# Patient Record
Sex: Female | Born: 1980 | Race: White | Hispanic: No | Marital: Married | Smoking: Never smoker
Health system: Southern US, Community
[De-identification: ages and names within clinical notes are randomized; demographics above are authoritative.]

## PROBLEM LIST (undated history)

## (undated) DIAGNOSIS — S0300XA Dislocation of jaw, unspecified side, initial encounter: Secondary | ICD-10-CM

## (undated) HISTORY — PX: TENDON REPAIR: SHX5111

## (undated) HISTORY — DX: Dislocation of jaw, unspecified side, initial encounter: S03.00XA

---

## 2011-02-12 ENCOUNTER — Ambulatory Visit: Payer: Self-pay | Admitting: Internal Medicine

## 2014-04-12 ENCOUNTER — Ambulatory Visit: Payer: Self-pay | Admitting: Specialist

## 2014-04-30 ENCOUNTER — Encounter: Payer: Self-pay | Admitting: Specialist

## 2014-05-21 ENCOUNTER — Encounter: Payer: Self-pay | Admitting: Specialist

## 2014-06-19 ENCOUNTER — Encounter
Admit: 2014-06-19 | Disposition: A | Discharge status: Home / Self Care | Payer: Self-pay | Attending: Specialist | Admitting: Specialist

## 2014-07-20 ENCOUNTER — Encounter
Admit: 2014-07-20 | Disposition: A | Discharge status: Home / Self Care | Payer: Self-pay | Attending: Specialist | Admitting: Specialist

## 2014-08-15 NOTE — Op Note (Signed)
PATIENT NAME:  Dana Baldwin, Dana Baldwin MR#:  [id number] DATE OF BIRTH:  1981-01-09  DATE OF PROCEDURE:  04/12/2014  PREOPERATIVE DIAGNOSIS: Laceration both flexor tendons, right little finger.   POSTOPERATIVE DIAGNOSIS: Laceration of flexor profundus tendon, right little finger.   PROCEDURE: Repair of flexor profundus tendon, right little finger in zone 2.   SURGEON: Myra Rudehristopher Rachel Rison, M.Baldwin.   ANESTHESIA: General.   COMPLICATIONS: None.   TOURNIQUET TIME: Approximately 50 minutes.   DESCRIPTION OF PROCEDURE: Two grams of Ancef was given intravenously prior to the procedure. General anesthesia is induced. The patient's arm and hand are thoroughly prepped with alcohol and ChloraPrep and draped in standard sterile fashion. The extremity is wrapped out with the Esmarch bandage and pneumatic tourniquet elevated to 250 mmHg. The original wound which is healed is transverse and is at the volar PIP joint. This was entered and then under loupe magnification was extended in a PrienBruner fashion both proximally and distally. The dissection is carried down to the flexor tendon sheath. The flexor superficialis tendon is seen to be intact. The flexor profundus tendon was completely lacerated but had not retracted into the palm. Approximately 50% of the A4 pulley and 50% of the A2 pulley were necessary to bring the tendon ends together by flexing the distal joint. Repair of the tendon was then performed using a modified Kessler type suture with a separate horizontal mattress suture, all of 3-0 Ethibond. A separate 6-0 nylon was then used around the rim of the repair to smooth this off. Range of motion of the finger was performed and there was excellent gliding of the tendon. The wound is thoroughly irrigated multiple times. Skin edges are closed with 5-0 nylon. A soft bulky dressing is applied with a dorsal splint keeping the wrist flexed approximately 20 degrees and the MP joint of the little finger and ring finger flexed  approximately 20 degrees. The tourniquet is released and the patient is returned to the recovery room in satisfactory condition having tolerated the procedure quite well.  ____________________________ Clare Gandyhristopher E. Vivek Grealish, MD ces:sb Baldwin: 04/19/2014 13:43:21 ET T: 04/19/2014 14:03:56 ET JOB#: [id number]  cc: Clare Gandyhristopher E. Dorota Heinrichs, MD, <Dictator> Clare GandyHRISTOPHER E Briane Birden MD ELECTRONICALLY SIGNED 04/24/2014 12:06

## 2017-01-08 NOTE — H&P (Signed)
Patient ID: Dana Baldwin is a 36 y.o. female presenting with Ovarian Cyst  on 01/08/2017  HPI: Pt with acute pelvic pain since 12/30/16. TVUS found bilateral large simple ovarian cysts, and on repeat u/s today the cysts were noted to be larger, 8 and 5cm, up from 6 and 4 cm 2 weeks ago.  The IUD is in the proper fundal location, and she loves it. Pain medicine is not helping any more and she is finding it difficult to complete ADLS.   Past Medical History:  has a past medical history of Allergic rhinitis and Other nonspecific abnormal finding.  Past Surgical History:  has a past surgical history that includes repair extensor tendon finger. Family History: family history includes Hereditary spherocytosis in her father and sister; High blood pressure (Hypertension) in her father. Social History:  reports that she has never smoked. She has never used smokeless tobacco. She reports that she drinks alcohol. She reports that she does not use drugs. OB/GYN History:  OB History    Gravida Para Term Preterm AB Living   SAB TAB Ectopic Molar Multiple Live Births             2      Allergies: is allergic to mucinex [guaifenesin] and sudafed [pseudoephedrine hcl]. Medications:  Current Outpatient Prescriptions:  .  acetaminophen (TYLENOL) 500 mg capsule, Take 500 mg by mouth as needed for Fever., Disp: , Rfl:  .  levonorgestrel (MIRENA) 20 mcg/24 hr (5 years) IUD, Insert 1 each into the uterus once. Follow package directions., Disp: , Rfl:  .  oxyCODONE-acetaminophen (PERCOCET) 5-325 mg tablet, Take 1 tablet by mouth every 4 (four) hours as needed for Pain., Disp: 20 tablet, Rfl: 0   Review of Systems: No SOB, no palpitations or chest pain, no new lower extremity edema, no nausea or vomiting or bowel or bladder complaints. See HPI for gyn specific ROS.   Exam:     BP 128/70   Ht 160 cm ( )   Wt 64.4 kg (142 lb)   BMI 25.15 kg/m   General: Patient is well-groomed,  well-nourished, appears stated age in no acute distress  HEENT: head is atraumatic and normocephalic, trachea is midline, neck is supple with no palpable nodules  CV: Regular rhythm and normal heart rate, no murmur  Pulm: Clear to auscultation throughout lung fields with no wheezing, crackles, or rhonchi. No increased work of breathing  Abdomen: soft , no mass, non-tender, no rebound tenderness, no hepatomegaly  Pelvic: deferred  Impression:   The primary encounter diagnosis was Cyst of ovary, left. A diagnosis of Dermoid cyst of ovary, right was also pertinent to this visit.    Plan:    Patient returns for a preoperative discussion regarding her plans to proceed with surgical treatment of her acute pelvic pain and ovarian cysts by dx lap with bilateral ovarian cystectomy procedure.  The patient and I discussed the technical aspects of the procedure including the potential for risks and complications.  These include but are not limited to the risk of infection requiring post-operative antibiotics or further procedures.  We talked about the risk of injury to adjacent organs including bladder, bowel, ureter, blood vessels or nerves.  We talked about the need to convert to an open incision.  We talked about the possible need for blood transfusion.  We talked about postop complications such as thromboembolic or cardiopulmonary complications.  All of her questions  were answered.  Her preoperative exam was completed and the appropriate consents were signed. She is scheduled to undergo this procedure in the near future.  Specific Peri-operative Considerations:  - Consent: obtained today - Labs: CBC, CMP preoperatively - Studies: EKG, CXR preoperatively - Bowel Preparation: None required - Abx:  None indicated - VTE ppx: SCDs perioperatively

## 2017-01-11 ENCOUNTER — Ambulatory Visit
Admission: RE | Admit: 2017-01-11 | Discharge: 2017-01-11 | Disposition: A | Discharge status: Home / Self Care | Payer: Managed Care, Other (non HMO) | Source: Ambulatory Visit | Attending: Obstetrics and Gynecology | Admitting: Obstetrics and Gynecology

## 2017-01-11 ENCOUNTER — Encounter: Payer: Self-pay | Admitting: *Deleted

## 2017-01-11 ENCOUNTER — Ambulatory Visit: Payer: Managed Care, Other (non HMO) | Admitting: Certified Registered"

## 2017-01-11 ENCOUNTER — Encounter
Admission: RE | Disposition: A | Discharge status: Home / Self Care | Payer: Self-pay | Source: Ambulatory Visit | Attending: Obstetrics and Gynecology

## 2017-01-11 DIAGNOSIS — N8301 Follicular cyst of right ovary: Secondary | ICD-10-CM | POA: Insufficient documentation

## 2017-01-11 DIAGNOSIS — Z888 Allergy status to other drugs, medicaments and biological substances status: Secondary | ICD-10-CM | POA: Diagnosis not present

## 2017-01-11 DIAGNOSIS — N83209 Unspecified ovarian cyst, unspecified side: Secondary | ICD-10-CM | POA: Diagnosis present

## 2017-01-11 DIAGNOSIS — R102 Pelvic and perineal pain: Secondary | ICD-10-CM | POA: Diagnosis present

## 2017-01-11 DIAGNOSIS — N8302 Follicular cyst of left ovary: Secondary | ICD-10-CM | POA: Diagnosis not present

## 2017-01-11 DIAGNOSIS — Z960 Presence of urogenital implants: Secondary | ICD-10-CM | POA: Insufficient documentation

## 2017-01-11 HISTORY — PX: LAPAROSCOPIC OVARIAN CYSTECTOMY: SHX6248

## 2017-01-11 LAB — BASIC METABOLIC PANEL
ANION GAP: 7 (ref 5–15)
BUN: 11 mg/dL (ref 6–20)
CHLORIDE: 106 mmol/L (ref 101–111)
CO2: 25 mmol/L (ref 22–32)
Calcium: 8.9 mg/dL (ref 8.9–10.3)
Creatinine, Ser: 0.52 mg/dL (ref 0.44–1.00)
GFR calc Af Amer: >60 mL/min (ref >60)
GLUCOSE: 112 mg/dL — AB (ref 65–99)
POTASSIUM: 3.8 mmol/L (ref 3.5–5.1)
Sodium: 138 mmol/L (ref 135–145)

## 2017-01-11 LAB — ABO/RH: ABO/RH(D): O POS

## 2017-01-11 LAB — TYPE AND SCREEN
ABO/RH(D): O POS
Antibody Screen: NEGATIVE

## 2017-01-11 LAB — CBC
HCT: 37.4 % (ref 35.0–47.0)
HEMOGLOBIN: 13.3 g/dL (ref 12.0–16.0)
MCH: 31.9 pg (ref 26.0–34.0)
MCHC: 35.7 g/dL (ref 32.0–36.0)
MCV: 89.6 fL (ref 80.0–100.0)
PLATELETS: 269 10*3/uL (ref 150–440)
RBC: 4.18 MIL/uL (ref 3.80–5.20)
RDW: 12.7 % (ref 11.5–14.5)
WBC: 10.9 10*3/uL (ref 3.6–11.0)

## 2017-01-11 LAB — POCT PREGNANCY, URINE: PREG TEST UR: NEGATIVE

## 2017-01-11 SURGERY — EXCISION, CYST, OVARY, LAPAROSCOPIC
Anesthesia: General | Site: Abdomen | Laterality: Bilateral | Wound class: Clean Contaminated

## 2017-01-11 MED ORDER — PHENYLEPHRINE HCL 10 MG/ML IJ SOLN
INTRAMUSCULAR | Status: DC | PRN
  Administered 2017-01-11: 100 ug via INTRAVENOUS

## 2017-01-11 MED ORDER — FENTANYL CITRATE (PF) 100 MCG/2ML IJ SOLN
INTRAMUSCULAR | Status: DC | PRN
  Administered 2017-01-11: 100 ug via INTRAVENOUS
  Administered 2017-01-11: 50 ug via INTRAVENOUS

## 2017-01-11 MED ORDER — ACETAMINOPHEN 500 MG PO TABS: 1000.0000 mg | ORAL_TABLET | Freq: Four times a day (QID) | ORAL | 0 refills | Status: AC

## 2017-01-11 MED ORDER — DEXAMETHASONE SODIUM PHOSPHATE 10 MG/ML IJ SOLN
INTRAMUSCULAR | Status: DC | PRN
  Administered 2017-01-11: 10 mg via INTRAVENOUS

## 2017-01-11 MED ORDER — MIDAZOLAM HCL 2 MG/2ML IJ SOLN
INTRAMUSCULAR | Status: DC | PRN
  Administered 2017-01-11: 2 mg via INTRAVENOUS

## 2017-01-11 MED ORDER — SUGAMMADEX SODIUM 200 MG/2ML IV SOLN
INTRAVENOUS | Status: AC
  Filled 2017-01-11: qty 2

## 2017-01-11 MED ORDER — IBUPROFEN 800 MG PO TABS
800.0000 mg | ORAL_TABLET | Freq: Three times a day (TID) | ORAL | 1 refills | Status: DC | PRN
Start: 2017-01-11 — End: 2020-03-18

## 2017-01-11 MED ORDER — DEXAMETHASONE SODIUM PHOSPHATE 10 MG/ML IJ SOLN
INTRAMUSCULAR | Status: AC
Start: 2017-01-11 — End: 2017-01-11
  Filled 2017-01-11: qty 2

## 2017-01-11 MED ORDER — GABAPENTIN 300 MG PO CAPS
ORAL_CAPSULE | ORAL | Status: AC
  Administered 2017-01-11: 900 mg via ORAL
  Filled 2017-01-11: qty 3

## 2017-01-11 MED ORDER — PROPOFOL 10 MG/ML IV BOLUS
INTRAVENOUS | Status: DC | PRN
  Administered 2017-01-11: 150 mg via INTRAVENOUS

## 2017-01-11 MED ORDER — OXYCODONE HCL 5 MG PO TABS
5.0000 mg | ORAL_TABLET | Freq: Once | ORAL | Status: AC
  Administered 2017-01-11: 5 mg via ORAL
  Filled 2017-01-11: qty 1

## 2017-01-11 MED ORDER — FENTANYL CITRATE (PF) 100 MCG/2ML IJ SOLN
INTRAMUSCULAR | Status: AC
  Administered 2017-01-11: 25 ug via INTRAVENOUS
  Filled 2017-01-11: qty 2

## 2017-01-11 MED ORDER — OXYCODONE HCL 5 MG PO TABS
ORAL_TABLET | ORAL | Status: AC
  Filled 2017-01-11: qty 1

## 2017-01-11 MED ORDER — ONDANSETRON HCL 4 MG/2ML IJ SOLN
INTRAMUSCULAR | Status: AC
  Filled 2017-01-11: qty 4

## 2017-01-11 MED ORDER — KETOROLAC TROMETHAMINE 30 MG/ML IJ SOLN
INTRAMUSCULAR | Status: AC
  Filled 2017-01-11: qty 2

## 2017-01-11 MED ORDER — BUPIVACAINE HCL (PF) 0.5 % IJ SOLN
INTRAMUSCULAR | Status: AC
  Filled 2017-01-11: qty 30

## 2017-01-11 MED ORDER — DOCUSATE SODIUM 100 MG PO CAPS: 100.0000 mg | ORAL_CAPSULE | Freq: Two times a day (BID) | ORAL | 0 refills | Status: DC

## 2017-01-11 MED ORDER — SUGAMMADEX SODIUM 200 MG/2ML IV SOLN
INTRAVENOUS | Status: DC | PRN
Start: 2017-01-11 — End: 2017-01-11
  Administered 2017-01-11: 130 mg via INTRAVENOUS

## 2017-01-11 MED ORDER — FAMOTIDINE 20 MG PO TABS
ORAL_TABLET | ORAL | Status: AC
  Administered 2017-01-11: 20 mg via ORAL
  Filled 2017-01-11: qty 1

## 2017-01-11 MED ORDER — ONDANSETRON HCL 4 MG/2ML IJ SOLN
INTRAMUSCULAR | Status: DC | PRN
  Administered 2017-01-11: 4 mg via INTRAVENOUS

## 2017-01-11 MED ORDER — LACTATED RINGERS IV SOLN: INTRAVENOUS | Status: DC

## 2017-01-11 MED ORDER — OXYCODONE HCL 5 MG PO CAPS: 5.0000 mg | ORAL_CAPSULE | Freq: Four times a day (QID) | ORAL | 0 refills | Status: DC | PRN

## 2017-01-11 MED ORDER — LIDOCAINE HCL (CARDIAC) 20 MG/ML IV SOLN
INTRAVENOUS | Status: DC | PRN
  Administered 2017-01-11: 100 mg via INTRAVENOUS

## 2017-01-11 MED ORDER — FAMOTIDINE 20 MG PO TABS
20.0000 mg | ORAL_TABLET | Freq: Once | ORAL | Status: AC
  Administered 2017-01-11: 20 mg via ORAL

## 2017-01-11 MED ORDER — FENTANYL CITRATE (PF) 100 MCG/2ML IJ SOLN
INTRAMUSCULAR | Status: AC
  Filled 2017-01-11: qty 2

## 2017-01-11 MED ORDER — ROCURONIUM BROMIDE 100 MG/10ML IV SOLN
INTRAVENOUS | Status: DC | PRN
  Administered 2017-01-11: 40 mg via INTRAVENOUS
  Administered 2017-01-11: 10 mg via INTRAVENOUS

## 2017-01-11 MED ORDER — ACETAMINOPHEN 500 MG PO TABS
ORAL_TABLET | ORAL | Status: AC
  Administered 2017-01-11: 1000 mg via ORAL
  Filled 2017-01-11: qty 2

## 2017-01-11 MED ORDER — LIDOCAINE HCL (PF) 2 % IJ SOLN
INTRAMUSCULAR | Status: AC
  Filled 2017-01-11: qty 2

## 2017-01-11 MED ORDER — ACETAMINOPHEN 500 MG PO TABS
1000.0000 mg | ORAL_TABLET | ORAL | Status: AC
  Administered 2017-01-11: 1000 mg via ORAL

## 2017-01-11 MED ORDER — MIDAZOLAM HCL 2 MG/2ML IJ SOLN
INTRAMUSCULAR | Status: AC
  Filled 2017-01-11: qty 2

## 2017-01-11 MED ORDER — FENTANYL CITRATE (PF) 100 MCG/2ML IJ SOLN
25.0000 ug | INTRAMUSCULAR | Status: DC | PRN
  Administered 2017-01-11 (×4): 25 ug via INTRAVENOUS

## 2017-01-11 MED ORDER — LACTATED RINGERS IV SOLN
INTRAVENOUS | Status: DC | PRN
  Administered 2017-01-11: 13:00:00 via INTRAVENOUS

## 2017-01-11 MED ORDER — BUPIVACAINE HCL 0.5 % IJ SOLN
INTRAMUSCULAR | Status: DC | PRN
  Administered 2017-01-11: 10 mL

## 2017-01-11 MED ORDER — GLYCOPYRROLATE 0.2 MG/ML IJ SOLN
INTRAMUSCULAR | Status: DC | PRN
  Administered 2017-01-11: 0.2 mg via INTRAVENOUS

## 2017-01-11 MED ORDER — GABAPENTIN 300 MG PO CAPS
900.0000 mg | ORAL_CAPSULE | ORAL | Status: AC
  Administered 2017-01-11: 900 mg via ORAL

## 2017-01-11 MED ORDER — ONDANSETRON HCL 4 MG/2ML IJ SOLN: 4.0000 mg | Freq: Once | INTRAMUSCULAR | Status: DC | PRN

## 2017-01-11 SURGICAL SUPPLY — 47 items
APPLICATOR ARISTA FLEXITIP XL (MISCELLANEOUS) ×3 IMPLANT
APPLICATOR COTTON TIP 6IN STRL (MISCELLANEOUS) ×3 IMPLANT
BAG URO DRAIN 2000ML W/SPOUT (MISCELLANEOUS) IMPLANT
BLADE SURG SZ11 CARB STEEL (BLADE) ×3 IMPLANT
CATH FOLEY 2WAY  5CC 16FR (CATHETERS) ×2
CATH ROBINSON RED A/P 16FR (CATHETERS) ×3 IMPLANT
CATH URTH 16FR FL 2W BLN LF (CATHETERS) ×1 IMPLANT
CHLORAPREP W/TINT 26ML (MISCELLANEOUS) ×3 IMPLANT
CLOSURE WOUND 1/4X4 (GAUZE/BANDAGES/DRESSINGS) ×1
DERMABOND ADVANCED (GAUZE/BANDAGES/DRESSINGS) ×2
DERMABOND ADVANCED .7 DNX12 (GAUZE/BANDAGES/DRESSINGS) ×1 IMPLANT
DRSG TEGADERM 2-3/8X2-3/4 SM (GAUZE/BANDAGES/DRESSINGS) ×9 IMPLANT
GAUZE SPONGE NON-WVN 2X2 STRL (MISCELLANEOUS) ×2 IMPLANT
GLOVE BIO SURGEON STRL SZ7 (GLOVE) ×6 IMPLANT
GLOVE INDICATOR 7.5 STRL GRN (GLOVE) ×3 IMPLANT
GOWN STRL REUS W/ TWL LRG LVL3 (GOWN DISPOSABLE) ×2 IMPLANT
GOWN STRL REUS W/TWL LRG LVL3 (GOWN DISPOSABLE) ×4
GRASPER SUT TROCAR 14GX15 (MISCELLANEOUS) ×3 IMPLANT
HEMOSTAT ARISTA ABSORB 1G (MISCELLANEOUS) ×3 IMPLANT
IRRIGATION STRYKERFLOW (MISCELLANEOUS) ×1 IMPLANT
IRRIGATOR STRYKERFLOW (MISCELLANEOUS) ×3
IV NS 1000ML (IV SOLUTION) ×2
IV NS 1000ML BAXH (IV SOLUTION) ×1 IMPLANT
KIT PINK PAD W/HEAD ARE REST (MISCELLANEOUS) ×3
KIT PINK PAD W/HEAD ARM REST (MISCELLANEOUS) ×1 IMPLANT
KIT RM TURNOVER CYSTO AR (KITS) ×3 IMPLANT
LABEL OR SOLS (LABEL) ×3 IMPLANT
LIGASURE LAP MARYLAND 5MM 37CM (ELECTROSURGICAL) ×3 IMPLANT
NS IRRIG 500ML POUR BTL (IV SOLUTION) ×3 IMPLANT
PACK GYN LAPAROSCOPIC (MISCELLANEOUS) ×3 IMPLANT
PAD OB MATERNITY 4.3X12.25 (PERSONAL CARE ITEMS) ×3 IMPLANT
PAD PREP 24X41 OB/GYN DISP (PERSONAL CARE ITEMS) ×3 IMPLANT
POUCH SPECIMEN RETRIEVAL 10MM (ENDOMECHANICALS) ×3 IMPLANT
SCISSORS METZENBAUM CVD 33 (INSTRUMENTS) ×3 IMPLANT
SLEEVE ENDOPATH XCEL 5M (ENDOMECHANICALS) ×3 IMPLANT
SPONGE VERSALON 2X2 STRL (MISCELLANEOUS) ×4
STRIP CLOSURE SKIN 1/4X4 (GAUZE/BANDAGES/DRESSINGS) ×2 IMPLANT
SUT MNCRL AB 4-0 PS2 18 (SUTURE) ×3 IMPLANT
SUT VIC AB 0 CT1 36 (SUTURE) ×3 IMPLANT
SUT VIC AB 2-0 UR6 27 (SUTURE) ×3 IMPLANT
SUT VIC AB 4-0 SH 27 (SUTURE) ×2
SUT VIC AB 4-0 SH 27XANBCTRL (SUTURE) ×1 IMPLANT
SWABSTK COMLB BENZOIN TINCTURE (MISCELLANEOUS) ×3 IMPLANT
SYR 30ML LL (SYRINGE) ×3 IMPLANT
TROCAR XCEL NON-BLD 11X100MML (ENDOMECHANICALS) ×3 IMPLANT
TROCAR XCEL NON-BLD 5MMX100MML (ENDOMECHANICALS) ×6 IMPLANT
TUBING INSUFFLATOR HI FLOW (MISCELLANEOUS) ×3 IMPLANT

## 2017-01-11 NOTE — Anesthesia Postprocedure Evaluation (Signed)
Anesthesia Post Note  Patient: NASHANTI DUQUETTE  Procedure(s) Performed: Procedure(s) (LRB): LAPAROSCOPIC OVARIAN CYSTECTOMY (Bilateral)  Patient location during evaluation: PACU Anesthesia Type: General Level of consciousness: awake and alert and oriented Pain management: pain level controlled Vital Signs Assessment: post-procedure vital signs reviewed and stable Respiratory status: spontaneous breathing, nonlabored ventilation and respiratory function stable Cardiovascular status: blood pressure returned to baseline and stable Postop Assessment: no signs of nausea or vomiting Anesthetic complications: no     Last Vitals:  Vitals:   01/11/17 1536 01/11/17 1551  BP: (!) 105/41 (!) 102/45  Pulse: 60 80  Resp: 16 13  Temp:    SpO2: 100% 95%    Last Pain:  Vitals:   01/11/17 1602  TempSrc:   PainSc: 5                  Zina Pitzer

## 2017-01-11 NOTE — Anesthesia Post-op Follow-up Note (Signed)
Anesthesia QCDR form completed.        

## 2017-01-11 NOTE — Anesthesia Preprocedure Evaluation (Signed)
Anesthesia Evaluation  Patient identified by MRN, date of birth, ID band Patient awake    Reviewed: Allergy & Precautions, NPO status , Patient's Chart, lab work & pertinent test results  Airway Mallampati: II  TM Distance: >3 FB     Dental   Pulmonary neg pulmonary ROS,    Pulmonary exam normal        Cardiovascular negative cardio ROS Normal cardiovascular exam     Neuro/Psych negative neurological ROS  negative psych ROS   GI/Hepatic negative GI ROS, Neg liver ROS,   Endo/Other  negative endocrine ROS  Renal/GU negative Renal ROS  Female GU complaint     Musculoskeletal negative musculoskeletal ROS (+)   Abdominal Normal abdominal exam  (+)   Peds negative pediatric ROS (+)  Hematology negative hematology ROS (+)   Anesthesia Other Findings   Reproductive/Obstetrics                             Anesthesia Physical Anesthesia Plan  ASA: I  Anesthesia Plan: General   Post-op Pain Management:    Induction: Intravenous  PONV Risk Score and Plan:   Airway Management Planned: Oral ETT  Additional Equipment:   Intra-op Plan:   Post-operative Plan: Extubation in OR  Informed Consent: I have reviewed the patients History and Physical, chart, labs and discussed the procedure including the risks, benefits and alternatives for the proposed anesthesia with the patient or authorized representative who has indicated his/her understanding and acceptance.   Dental advisory given  Plan Discussed with: CRNA and Surgeon  Anesthesia Plan Comments:         Anesthesia Quick Evaluation

## 2017-01-11 NOTE — Anesthesia Procedure Notes (Signed)
Procedure Name: Intubation Date/Time: 01/11/2017 2:07 PM Performed by: Lance Muss Pre-anesthesia Checklist: Patient identified, Patient being monitored, Timeout performed, Emergency Drugs available and Suction available Patient Re-evaluated:Patient Re-evaluated prior to induction Oxygen Delivery Method: Circle system utilized Preoxygenation: Pre-oxygenation with 100% oxygen Induction Type: IV induction Ventilation: Mask ventilation without difficulty Laryngoscope Size: Mac and 3 Grade View: Grade I Tube type: Oral Tube size: 7.0 mm Number of attempts: 1 Airway Equipment and Method: Stylet Placement Confirmation: ETT inserted through vocal cords under direct vision,  positive ETCO2 and breath sounds checked- equal and bilateral Secured at: 21 cm Tube secured with: Tape Dental Injury: Teeth and Oropharynx as per pre-operative assessment

## 2017-01-11 NOTE — Discharge Instructions (Addendum)
Here is a helpful article from the website http://mitchell.org/, regarding constipation  Here are reasons why constipation occurs after surgery: 1) During the operation and in the recovery room, most people are given opioid pain medication, primarily through an IV, to treat moderate or severe pain. Intravenous opioids include morphine, Dilaudid and fentanyl. After surgery, patients are often prescribed opioid pain medication to take by mouth at home, including codeine, Vicodin, Norco, and Percocet. All of these medications cause constipation by slowing down the movement of your intestine. 2) Changes in your diet before surgery can be another culprit. It is common to get specific instructions to change how you normally eat or drink before your surgery, like only having liquids the day before or not having anything to eat or drink after midnight the night before surgery. For this reason, temporary dehydration may occur. This, along with not eating or only having liquids, means that you are getting less fiber than usual. Both these factors contribute to constipation. 3) Changes in your diet after surgery can also contribute to the problem. Although many people dont have dietary restrictions after operations, being under anesthesia can make you lose your appetite for several hours and maybe even days. Some people can even have nausea or vomiting. Not eating or drinking normally means that you are not getting enough fiber and you can get dehydrated, both leading to constipation. 4) Lying in a bed more than usual--which happens before, during and after surgery--combined with the medications and diet changes, all work together to slow down your colon and make your poop turn to rock.  No one likes to be constipated.  Lets face it, its not a pleasant feeling when you dont poop for days, then strain on the toilet to finally pass something large enough to cause damage. An ounce of prevention is worth a pound  of cure, so: 1. Assume you will be constipated. 2. Plan and prepare accordingly. Post-surgery is one of those unique situations where the temporary use of laxatives can make a world of difference. Always consult with your doctor, and recognize that if you wait several days after surgery to take a laxative, the constipation might be too severe for these over-the-counter options. It is always important to discuss all medications you plan on taking with your doctor. Ask your doctor if you can start the laxative immediately after surgery. *  Here are go-to post-surgery laxatives: Senna: Senna is an herb that acts as a stimulant laxative, meaning it increases the activity of the intestine to cause you to have a bowel movement. It comes in many forms, but senna pills are easy to take and are sold over the counter at almost all pharmacies. Since opioid pain medications slow down the activity of the intestine, it makes sense to take a medication to help reverse that side effect. Long-term use of a stimulant laxative is not a good idea since it can make your colon lazy and not function properly; however, temporary use immediately after surgery is acceptable. In general, if you are able to eat a normal diet, taking senna soon after surgery works the best. Senna usually works within hours to produce a bowel movement, but this is less predictable when you are taking different medications after surgery. Try not to wait several days to start taking senna, as often it is too late by then. Just like with all medications or supplements, check with your doctor before starting new treatment.   Magnesium: Magnesium  is an important mineral that our body needs. We get magnesium from some foods that we eat, especially foods that are high in fiber such as broccoli, almonds and whole grains. There are also magnesium-based medications used to treat constipation including milk of magnesia (magnesium hydroxide), magnesium citrate  and magnesium oxide. They work by drawing water into the intestine, putting it into the class of osmotic laxatives. Magnesium products in low doses appear to be safe, but if taken in very large doses, can lead to problems such as irregular heartbeat, low blood pressure and even death. It can also affect other medications you might be taking, therefore it is important to discuss using magnesium with your physician and pharmacist before initiating therapy. Most over-the-counter magnesium laxatives work very well to help with the constipation related to surgery, but sometimes they work too well and lead to diarrhea. Make sure you are somewhere with easy access to a bathroom, just in case.   Bisacodyl: Bisacodyl (generic name) is sold under brand names such as Dulcolax. Much like senna, it is a stimulant laxative, meaning it makes your intestines move more quickly to push out the stool. This is another good choice to start taking as soon as your doctor says you can take a laxative after surgery. It comes in pill form and as a suppository, which is a good choice for people who cannot or are not allowed to swallow pills. Studies have shown that it works as a laxative, but like most of these medications, you should use this on a short-term basis only.   Enema: Enemas strike fear in many people, but FEAR NOT! Its nowhere near as big a deal as you may think. An enema is just a way to get some liquid into your rectum by placing a specially designed device through your anus. If you have never done one, it might seem like a painful, unpleasant, uncomfortable, complicated and lengthy procedure. But in reality, its simple, takes just a few seconds and is highly effective. The small ready-made bottles you buy at the pharmacy are much easier than the hose/large rubber container type. Those recommended positions illustrated in some instructions are generally not necessary to place the enema. Its very similar to the  insertion of a tampon, requiring a slight squat. Some extra lubrication on the enemas tip (or on your anus) will make it a breeze. In certain cases, there is no substitute for a good enema. For example, if someone has not pooped for a few days, the beginning of the poop waiting to come out can become rock hard. Passing that hard stool can lead to much pain and problems like anal fissures. Inserting a little liquid to break up the rock-hard stool will help make its passage much easier. Enemas come with different liquids. Most come with saline, but there are also mineral oil options. You can also use warm water in the reusable enema containers. They all work. But since saline can sometimes be irritating, so try a mineral oil or water enema instead.  Here are commonly recommended constipation medications that do not work well for post-surgery constipation: Docusate: Docusate (generic name) most commonly referred to as Colace (brand name) is not really a laxative, but is classified as a stool softener. Although this medication is commonly prescribed, it is not recommended for several reasons: 1) there is no good medical evidence that it works 2) even if it has an effect, which is very questionable, it is minimal and cannot combat the  intestinal slowing caused by the opioid medications. Skip docusate to save money and space in your pillbox for something more effective.  PEG: Miralax (brand name) is basically a chemical called polyethylene glycol (PEG) and it has gained tremendous popularity as a laxative. This product is an osmotic laxative meaning it works by pulling water into the stool, making it softer. This is very similar to the action of natural fiber in foods and supplements. Therefore, the effect seen by this medication is not immediate, causing a bowel movement in a day or more. Is this medication strong enough to battle the constipation related to having an operation? Maybe for some people not prone  to constipation. But for most people, other laxatives are better to prevent constipation after surgery. Laparoscopic Ovarian Surgery Discharge Instructions  RISKS AND COMPLICATIONS   Infection.  Bleeding.  Injury to surrounding organs.  Anesthetic side effects.   PROCEDURE   You may be given a medicine to help you relax (sedative) before the procedure. You will be given a medicine to make you sleep (general anesthetic) during the procedure.  A tube will be put down your throat to help your breath while under general anesthesia.  Several small cuts (incisions) are made in the lower abdominal area and one incision is made near the belly button.  Your abdominal area will be inflated with a safe gas (carbon dioxide). This helps give the surgeon room to operate, visualize, and helps the surgeon avoid other organs.  A thin, lighted tube (laparoscope) with a camera attached is inserted into your abdomen through the incision near the belly button. Other small instruments may also be inserted through other abdominal incisions.  The ovary is located and are removed.  After the ovary is removed, the gas is released from the abdomen.  The incisions will be closed with stitches (sutures), and Dermabond. A bandage may be placed over the incisions.  AFTER THE PROCEDURE   You will also have some mild abdominal discomfort for 3-7 days. You will be given pain medicine to ease any discomfort.  As long as there are no problems, you may be allowed to go home. Someone will need to drive you home and be with you for at least 24 hours once home.  You may have some mild discomfort in the throat. This is from the tube placed in your throat while you were sleeping.  You may experience discomfort in the shoulder area from some trapped air between the liver and diaphragm. This sensation is normal and will slowly go away on its own.  HOME CARE INSTRUCTIONS   Take all medicines as directed.  Only take  over-the-counter or prescription medicines for pain, discomfort, or fever as directed by your caregiver.  Resume daily activities as directed.  Showers are preferred over baths for 2 weeks.  You may resume sexual activities in 1 week or as you feel you would like to.  Do not drive while taking narcotics.  SEEK MEDICAL CARE IF: .  There is increasing abdominal pain.  You feel lightheaded or faint.  You have the chills.  You have an oral temperature above 102 F (38.9 C).  There is pus-like (purulent) drainage from any of the wounds.  You are unable to pass gas or have a bowel movement.  You feel sick to your stomach (nauseous) or throw up (vomit) and can't control it with your medicines.  MAKE SURE YOU:   Understand these instructions.  Will watch your condition.  Will  get help right away if you are not doing well or get worse.  ExitCare Patient Information 2013 [place], [place].    AMBULATORY SURGERY  DISCHARGE INSTRUCTIONS   1) The drugs that you were given will stay in your system until tomorrow so for the next 24 hours you should not:  A) Drive an automobile B) Make any legal decisions C) Drink any alcoholic beverage   2) You may resume regular meals tomorrow.  Today it is better to start with liquids and gradually work up to solid foods.  You may eat anything you prefer, but it is better to start with liquids, then soup and crackers, and gradually work up to solid foods.   3) Please notify your doctor immediately if you have any unusual bleeding, trouble breathing, redness and pain at the surgery site, drainage, fever, or pain not relieved by medication.    4) Additional Instructions:        Please contact your physician with any problems or Same Day Surgery at [phone], Monday through Friday 6 am to 4 pm, or [place] at [facility] number at [phone].

## 2017-01-11 NOTE — Transfer of Care (Signed)
Immediate Anesthesia Transfer of Care Note  Patient: Dana Baldwin  Procedure(s) Performed: Procedure(s): LAPAROSCOPIC OVARIAN CYSTECTOMY (Bilateral)  Patient Location: PACU  Anesthesia Type:General  Level of Consciousness: drowsy and patient cooperative  Airway & Oxygen Therapy: Patient Spontanous Breathing and Patient connected to face mask oxygen  Post-op Assessment: Report given to RN and Post -op Vital signs reviewed and stable  Post vital signs: Reviewed and stable  Last Vitals:  Vitals:   01/11/17 1110 01/11/17 1140  BP:  (!) 108/46  Pulse:  61  Resp: 18   Temp:  36.7 C  SpO2:  100%    Last Pain:  Vitals:   01/11/17 1140  TempSrc: Oral  PainSc:          Complications: No apparent anesthesia complications

## 2017-01-11 NOTE — Op Note (Signed)
Dana Baldwin PROCEDURE DATE: 01/11/2017  INDICATIONS: Acute pelvic pain  PREOPERATIVE DIAGNOSIS: Bilateral Adnexal mass POSTOPERATIVE DIAGNOSIS: Large simple bilateral ovarian cysts, left ruptured and bleeding PROCEDURE: Exam under anesthesia, diagnostic laparoscopy, bilateral ovarian cystectomy SURGEON:  Dr. Christeen Douglas ASSISTANT: CST ANESTHESIOLOGIST: Alver Fisher, MD Anesthesiologist: Yves Dill, MD; Alver Fisher, MD CRNA: Casey Burkitt, CRNA; Deri Fuelling, CRNA; Michaele Offer, CRNA  INDICATIONS: 36 y.o. with history of acute-onset pelvic pain desiring surgical evaluation.   Please see preoperative notes for further details.  Risks of surgery were discussed with the patient including but not limited to: bleeding which may require transfusion or reoperation; infection which may require antibiotics; injury to bowel, bladder, ureters or other surrounding organs; need for additional procedures including laparotomy; thromboembolic phenomenon, incisional problems and other postoperative/anesthesia complications. Written informed consent was obtained.    FINDINGS:  Small uterus, enlarged bilateral ovaries and normal fallopian tubes bilaterally. Ovarian cysts on left and right side. +small hemorrhagic old blood appearing lesions on epiploica in pelvis.  Normal upper abdomen. + 50cc of Hemoperitoneum  ANESTHESIA:    General INTRAVENOUS FLUIDS: 900 ml ESTIMATED BLOOD LOSS: 0 ml HEMOPERITONEUM: 50ml URINE OUTPUT: 75 ml SPECIMENS: Ovarian cyst walls bilaterally COMPLICATIONS: None immediate  PROCEDURE IN DETAIL:  The patient had sequential compression devices applied to her lower extremities while in the preoperative area.  She was then taken to the operating room where general anesthesia was administered and was found to be adequate.  She was placed in the dorsal lithotomy position, and was prepped and draped in a sterile manner.  A Foley catheter was inserted into her bladder and  attached to constant drainage and a uterine manipulator was then advanced into the uterus .  After an adequate timeout was performed, attention was turned to the abdomen where an umbilical incision was made with the scalpel.  The Optiview 5-mm trocar and sleeve were then advanced without difficulty with the laparoscope under direct visualization into the abdomen.  The abdomen was then insufflated with carbon dioxide gas and adequate pneumoperitoneum was obtained.   A detailed survey of the patient's pelvis and abdomen revealed the findings as mentioned above. Two additional 5mm trocars were placed in the bilateral lower quadrants under direct visualization, though the left LQ port was replaced with a 11mm port to accommodate the bag.   The ovarian cysts were evaluated and dissected out from the normal ovarian tissue. The cyst walls were placed in a bad Hemostasis was assured with electrocautery. The pelvis was irrigated and all fluid and blood removed. The patient was placed in reverse Trendelenburg and all fluid removed. Arista placed over ovarian cyst bed.  Blood clots removed and small epiploical lesion removed with Ligasure.   The operative site was surveyed, and it was found to be hemostatic.  No intraoperative injury to surrounding organs was noted.   Pictures were taken of the quadrants and pelvis. The abdomen was desufflated and all instruments were then removed from the patient's abdomen. The uterine manipulator was removed without complications.  All incisions were closed with 4-0 Vicryl and Dermabond.   The patient tolerated the procedures well.  All instruments, needles, and sponge counts were correct x 2. The patient was taken to the recovery room in stable condition.

## 2017-01-11 NOTE — Interval H&P Note (Signed)
History and Physical Interval Note:  01/11/2017 12:56 PM  Dana Baldwin  has presented today for surgery, with the diagnosis of Ovarian cyst, acute pelvic pain  The various methods of treatment have been discussed with the patient and family. After consideration of risks, benefits and other options for treatment, the patient has consented to  Procedure(s): LAPAROSCOPIC OVARIAN CYSTECTOMY (Bilateral) as a surgical intervention .  The patient's history has been reviewed, patient examined, no change in status, stable for surgery.  I have reviewed the patient's chart and labs.  Questions were answered to the patient's satisfaction.     Christeen Douglas

## 2017-01-12 ENCOUNTER — Encounter: Payer: Self-pay | Admitting: Obstetrics and Gynecology

## 2017-01-13 LAB — SURGICAL PATHOLOGY

## 2017-04-20 DIAGNOSIS — N83209 Unspecified ovarian cyst, unspecified side: Secondary | ICD-10-CM

## 2017-04-20 HISTORY — DX: Unspecified ovarian cyst, unspecified side: N83.209

## 2017-04-29 NOTE — H&P (Signed)
Patient ID: Dana Baldwin is a 37 y.o. female presenting with Follow-up (continuing pain, rlq radiating to back)  on 04/29/2017  HPI:  S/p dx lap for ruptured hemorrhagic cyst in 12/2016, with blood in pelvis. We placed IUD and started OCPs/ She started having similar returning sx about a month ago, worsening now.  In right lower back significantly. Difficult to sit. Disturbing sleep.   TVUS last week with simple free fluid in pelvis, 4cm simple cyst on right ovary.  No dysuria, hematuria, bowel sx.  Still having serious pain, now radiating to her back. She rates it a 7/10, but is coping well.  Past Medical History:  has a past medical history of Allergic rhinitis and Other nonspecific abnormal finding.  Past Surgical History:  has a past surgical history that includes repair extensor tendon finger. Family History: family history includes Hereditary spherocytosis in her father and sister; High blood pressure (Hypertension) in her father. Social History:  reports that she has never smoked. She has never used smokeless tobacco. She reports that she drinks alcohol. She reports that she does not use drugs. OB/GYN History:          OB History    Gravida  2   Para  2   Term  2   Preterm      AB      Living  2     SAB      TAB      Ectopic      Molar      Multiple      Live Births  2          Allergies: is allergic to mucinex [guaifenesin] and sudafed [pseudoephedrine hcl]. Medications:  Current Outpatient Medications:  .  acetaminophen (TYLENOL) 500 mg capsule, Take 500 mg by mouth as needed for Fever., Disp: , Rfl:  .  drospirenone-e.estradiol-lm.FA (BEYAZ) 3-0.02-0.451 mg (24) (4), Take 1 each by mouth once daily, Disp: 28 tablet, Rfl: 11 .  levonorgestrel (MIRENA) 20 mcg/24 hr (5 years) IUD, Insert 1 each into the uterus once. Follow package directions., Disp: , Rfl:  .  oxyCODONE-acetaminophen (PERCOCET) 5-325 mg tablet, Take 1 tablet by mouth  every 4 (four) hours as needed for Pain. (Patient not taking: Reported on 04/22/2017 ), Disp: 20 tablet, Rfl: 0 .  sulfamethoxazole-trimethoprim (BACTRIM DS) 800-160 mg tablet, Take 1 tablet (160 mg of trimethoprim total) by mouth 2 (two) times daily. (Patient not taking: Reported on 04/22/2017 ), Disp: 6 tablet, Rfl: 0 .  traMADol (ULTRAM) 50 mg tablet, Take 1 tablet (50 mg total) by mouth once daily as needed for Pain, Disp: 15 tablet, Rfl: 1   Review of Systems: No SOB, no palpitations or chest pain, no new lower extremity edema, no nausea or vomiting or bowel or bladder complaints. See HPI for gyn specific ROS.   Exam:   BP 126/79   Pulse 68   Wt 65.7 kg (144 lb 12.8 oz)   BMI 25.65 kg/m   General: Patient is well-groomed, well-nourished, appears stated age in no acute distress  HEENT: head is atraumatic and normocephalic, trachea is midline, neck is supple with no palpable nodules  CV: Regular rhythm and normal heart rate, no murmur  Pulm: Clear to auscultation throughout lung fields with no wheezing, crackles, or rhonchi. No increased work of breathing  Abdomen: soft , no mass, non-tender, no rebound tenderness, no hepatomegaly Trigger point in RLQ  Pelvic: deferred   Impression:   There were  no encounter diagnoses.    Plan:   F/u visit for continued pelvic and low back pain, now interferring with sleep - pain and cyst on right, low back pain on right and left.  Because she has known pathology, will proceed with surgical intervention of her cyst and pain by dx lap with right cystectomy and possible oopherectomy procedure. The patient and I discussed the technical aspects of the procedure including the potential for risks and complications. These include but are not limited to the risk of infection requiring post-operative antibiotics or further procedures. We talked about the risk of injury to adjacent organs including bladder, bowel, ureter, blood  vessels or nerves. We talked about the need to convert to an open incision. We talked about the possible need for blood transfusion. We talked aboutpostop complications such asthromboembolic or cardiopulmonary complications. All of her questions were answered.  Her preoperative exam was completed and the appropriate consents were signed. She is scheduled to undergo this procedure in the near future.  Specific Peri-operative Considerations:  - Consent: obtained today - Health Maintenance: up to date - Labs: CBC, CMP preoperatively - Studies: EKG, CXR preoperatively - Bowel Preparation: None required - Abx:  Not indicated - VTE ppx: SCDs perioperatively

## 2017-04-30 ENCOUNTER — Ambulatory Visit: Payer: Managed Care, Other (non HMO) | Admitting: Anesthesiology

## 2017-04-30 ENCOUNTER — Encounter
Admission: RE | Disposition: A | Discharge status: Home / Self Care | Payer: Self-pay | Source: Ambulatory Visit | Attending: Obstetrics and Gynecology

## 2017-04-30 ENCOUNTER — Ambulatory Visit
Admission: RE | Admit: 2017-04-30 | Discharge: 2017-04-30 | Disposition: A | Discharge status: Home / Self Care | Payer: Managed Care, Other (non HMO) | Source: Ambulatory Visit | Attending: Obstetrics and Gynecology | Admitting: Obstetrics and Gynecology

## 2017-04-30 DIAGNOSIS — N8301 Follicular cyst of right ovary: Secondary | ICD-10-CM | POA: Insufficient documentation

## 2017-04-30 DIAGNOSIS — Z832 Family history of diseases of the blood and blood-forming organs and certain disorders involving the immune mechanism: Secondary | ICD-10-CM | POA: Insufficient documentation

## 2017-04-30 DIAGNOSIS — Z79899 Other long term (current) drug therapy: Secondary | ICD-10-CM | POA: Insufficient documentation

## 2017-04-30 DIAGNOSIS — G8929 Other chronic pain: Secondary | ICD-10-CM | POA: Insufficient documentation

## 2017-04-30 DIAGNOSIS — N83201 Unspecified ovarian cyst, right side: Secondary | ICD-10-CM | POA: Diagnosis present

## 2017-04-30 DIAGNOSIS — M545 Low back pain: Secondary | ICD-10-CM | POA: Insufficient documentation

## 2017-04-30 DIAGNOSIS — Z793 Long term (current) use of hormonal contraceptives: Secondary | ICD-10-CM | POA: Diagnosis not present

## 2017-04-30 DIAGNOSIS — Z888 Allergy status to other drugs, medicaments and biological substances status: Secondary | ICD-10-CM | POA: Insufficient documentation

## 2017-04-30 DIAGNOSIS — Z975 Presence of (intrauterine) contraceptive device: Secondary | ICD-10-CM | POA: Insufficient documentation

## 2017-04-30 DIAGNOSIS — Z9889 Other specified postprocedural states: Secondary | ICD-10-CM | POA: Insufficient documentation

## 2017-04-30 DIAGNOSIS — Z8249 Family history of ischemic heart disease and other diseases of the circulatory system: Secondary | ICD-10-CM | POA: Diagnosis not present

## 2017-04-30 DIAGNOSIS — R102 Pelvic and perineal pain: Secondary | ICD-10-CM | POA: Diagnosis present

## 2017-04-30 HISTORY — PX: LAPAROSCOPY: SHX197

## 2017-04-30 HISTORY — PX: LAPAROSCOPIC OVARIAN CYSTECTOMY: SHX6248

## 2017-04-30 LAB — BASIC METABOLIC PANEL
Anion gap: 8 (ref 5–15)
BUN: 13 mg/dL (ref 6–20)
CHLORIDE: 106 mmol/L (ref 101–111)
CO2: 24 mmol/L (ref 22–32)
CREATININE: 0.57 mg/dL (ref 0.44–1.00)
Calcium: 8.9 mg/dL (ref 8.9–10.3)
GFR calc non Af Amer: >60 mL/min (ref >60)
Glucose, Bld: 102 mg/dL — ABNORMAL HIGH (ref 65–99)
Potassium: 4.6 mmol/L (ref 3.5–5.1)
SODIUM: 138 mmol/L (ref 135–145)

## 2017-04-30 LAB — CBC
HEMATOCRIT: 39.9 % (ref 35.0–47.0)
HEMOGLOBIN: 13.9 g/dL (ref 12.0–16.0)
MCH: 31.3 pg (ref 26.0–34.0)
MCHC: 34.9 g/dL (ref 32.0–36.0)
MCV: 89.6 fL (ref 80.0–100.0)
Platelets: 316 10*3/uL (ref 150–440)
RBC: 4.45 MIL/uL (ref 3.80–5.20)
RDW: 12.9 % (ref 11.5–14.5)
WBC: 7.1 10*3/uL (ref 3.6–11.0)

## 2017-04-30 LAB — POCT PREGNANCY, URINE: Preg Test, Ur: NEGATIVE

## 2017-04-30 LAB — TYPE AND SCREEN
ABO/RH(D): O POS
ANTIBODY SCREEN: NEGATIVE

## 2017-04-30 SURGERY — LAPAROSCOPY, DIAGNOSTIC
Anesthesia: General | Laterality: Right

## 2017-04-30 MED ORDER — FENTANYL CITRATE (PF) 100 MCG/2ML IJ SOLN
INTRAMUSCULAR | Status: AC
  Administered 2017-04-30: 25 ug via INTRAVENOUS
  Filled 2017-04-30: qty 2

## 2017-04-30 MED ORDER — FENTANYL CITRATE (PF) 100 MCG/2ML IJ SOLN
INTRAMUSCULAR | Status: AC
  Filled 2017-04-30: qty 2

## 2017-04-30 MED ORDER — KETOROLAC TROMETHAMINE 30 MG/ML IJ SOLN
INTRAMUSCULAR | Status: AC
  Filled 2017-04-30: qty 1

## 2017-04-30 MED ORDER — ACETAMINOPHEN 10 MG/ML IV SOLN
INTRAVENOUS | Status: DC | PRN
  Administered 2017-04-30: 1000 mg via INTRAVENOUS

## 2017-04-30 MED ORDER — MIDAZOLAM HCL 2 MG/2ML IJ SOLN
INTRAMUSCULAR | Status: AC
  Filled 2017-04-30: qty 2

## 2017-04-30 MED ORDER — METHYLENE BLUE 0.5 % INJ SOLN
INTRAVENOUS | Status: AC
  Filled 2017-04-30: qty 10

## 2017-04-30 MED ORDER — FENTANYL CITRATE (PF) 100 MCG/2ML IJ SOLN
25.0000 ug | INTRAMUSCULAR | Status: AC | PRN
  Administered 2017-04-30 (×2): 25 ug via INTRAVENOUS

## 2017-04-30 MED ORDER — LACTATED RINGERS IV SOLN
INTRAVENOUS | Status: DC
  Administered 2017-04-30: 15:00:00 via INTRAVENOUS

## 2017-04-30 MED ORDER — PROPOFOL 10 MG/ML IV BOLUS
INTRAVENOUS | Status: AC
  Filled 2017-04-30: qty 20

## 2017-04-30 MED ORDER — MIDAZOLAM HCL 2 MG/2ML IJ SOLN
INTRAMUSCULAR | Status: DC | PRN
  Administered 2017-04-30: 2 mg via INTRAVENOUS

## 2017-04-30 MED ORDER — ONDANSETRON HCL 4 MG/2ML IJ SOLN
INTRAMUSCULAR | Status: DC | PRN
  Administered 2017-04-30: 4 mg via INTRAVENOUS

## 2017-04-30 MED ORDER — MEPERIDINE HCL 25 MG/ML IJ SOLN: 12.5000 mg | Freq: Once | INTRAMUSCULAR | Status: DC

## 2017-04-30 MED ORDER — OXYCODONE HCL 5 MG PO CAPS: 5.0000 mg | ORAL_CAPSULE | Freq: Four times a day (QID) | ORAL | 0 refills | Status: DC | PRN

## 2017-04-30 MED ORDER — DOCUSATE SODIUM 100 MG PO CAPS: 100.0000 mg | ORAL_CAPSULE | Freq: Two times a day (BID) | ORAL | 0 refills | Status: DC

## 2017-04-30 MED ORDER — ACETAMINOPHEN 500 MG PO TABS: 1000.0000 mg | ORAL_TABLET | Freq: Four times a day (QID) | ORAL | 0 refills | Status: AC

## 2017-04-30 MED ORDER — SUGAMMADEX SODIUM 200 MG/2ML IV SOLN
INTRAVENOUS | Status: DC | PRN
  Administered 2017-04-30: 150 mg via INTRAVENOUS

## 2017-04-30 MED ORDER — ACETAMINOPHEN 10 MG/ML IV SOLN
INTRAVENOUS | Status: AC
  Filled 2017-04-30: qty 100

## 2017-04-30 MED ORDER — IBUPROFEN 800 MG PO TABS: 800.0000 mg | ORAL_TABLET | Freq: Three times a day (TID) | ORAL | 1 refills | Status: DC

## 2017-04-30 MED ORDER — ONDANSETRON HCL 4 MG/2ML IJ SOLN: 4.0000 mg | Freq: Once | INTRAMUSCULAR | Status: DC | PRN

## 2017-04-30 MED ORDER — ROCURONIUM BROMIDE 100 MG/10ML IV SOLN
INTRAVENOUS | Status: DC | PRN
  Administered 2017-04-30: 20 mg via INTRAVENOUS

## 2017-04-30 MED ORDER — HYDROMORPHONE HCL 1 MG/ML IJ SOLN
0.5000 mg | INTRAMUSCULAR | Status: DC | PRN
  Administered 2017-04-30 (×2): 0.5 mg via INTRAVENOUS

## 2017-04-30 MED ORDER — SUGAMMADEX SODIUM 200 MG/2ML IV SOLN
INTRAVENOUS | Status: AC
  Filled 2017-04-30: qty 2

## 2017-04-30 MED ORDER — ONDANSETRON HCL 4 MG/2ML IJ SOLN
INTRAMUSCULAR | Status: AC
  Filled 2017-04-30: qty 2

## 2017-04-30 MED ORDER — MEPERIDINE HCL 50 MG/ML IJ SOLN
INTRAMUSCULAR | Status: AC
  Administered 2017-04-30: 12.5 mg
  Filled 2017-04-30: qty 1

## 2017-04-30 MED ORDER — GABAPENTIN 400 MG PO CAPS
800.0000 mg | ORAL_CAPSULE | Freq: Once | ORAL | Status: AC
  Administered 2017-04-30: 800 mg via ORAL

## 2017-04-30 MED ORDER — SUCCINYLCHOLINE CHLORIDE 20 MG/ML IJ SOLN
INTRAMUSCULAR | Status: DC | PRN
  Administered 2017-04-30: 100 mg via INTRAVENOUS

## 2017-04-30 MED ORDER — DEXAMETHASONE SODIUM PHOSPHATE 10 MG/ML IJ SOLN
INTRAMUSCULAR | Status: AC
Start: 2017-04-30 — End: 2017-04-30
  Filled 2017-04-30: qty 1

## 2017-04-30 MED ORDER — DEXAMETHASONE SODIUM PHOSPHATE 10 MG/ML IJ SOLN
INTRAMUSCULAR | Status: DC | PRN
  Administered 2017-04-30: 10 mg via INTRAVENOUS

## 2017-04-30 MED ORDER — BUPIVACAINE HCL (PF) 0.5 % IJ SOLN
INTRAMUSCULAR | Status: AC
  Filled 2017-04-30: qty 30

## 2017-04-30 MED ORDER — GABAPENTIN 800 MG PO TABS: 800.0000 mg | ORAL_TABLET | Freq: Every day | ORAL | 0 refills | Status: DC

## 2017-04-30 MED ORDER — HYDROMORPHONE HCL 1 MG/ML IJ SOLN
INTRAMUSCULAR | Status: AC
  Administered 2017-04-30: 0.5 mg via INTRAVENOUS
  Filled 2017-04-30: qty 1

## 2017-04-30 MED ORDER — FENTANYL CITRATE (PF) 100 MCG/2ML IJ SOLN
INTRAMUSCULAR | Status: DC | PRN
  Administered 2017-04-30: 100 ug via INTRAVENOUS

## 2017-04-30 MED ORDER — LIDOCAINE HCL (CARDIAC) 20 MG/ML IV SOLN
INTRAVENOUS | Status: DC | PRN
  Administered 2017-04-30: 50 mg via INTRAVENOUS

## 2017-04-30 MED ORDER — GABAPENTIN 400 MG PO CAPS
ORAL_CAPSULE | ORAL | Status: AC
  Filled 2017-04-30: qty 2

## 2017-04-30 MED ORDER — KETOROLAC TROMETHAMINE 30 MG/ML IJ SOLN
INTRAMUSCULAR | Status: DC | PRN
  Administered 2017-04-30: 30 mg via INTRAVENOUS

## 2017-04-30 MED ORDER — FENTANYL CITRATE (PF) 100 MCG/2ML IJ SOLN
25.0000 ug | INTRAMUSCULAR | Status: AC | PRN
  Administered 2017-04-30 (×6): 25 ug via INTRAVENOUS

## 2017-04-30 MED ORDER — PROPOFOL 10 MG/ML IV BOLUS
INTRAVENOUS | Status: DC | PRN
  Administered 2017-04-30: 150 mg via INTRAVENOUS

## 2017-04-30 SURGICAL SUPPLY — 40 items
BAG URINE DRAINAGE (UROLOGICAL SUPPLIES) ×4 IMPLANT
BLADE SURG SZ11 CARB STEEL (BLADE) ×4 IMPLANT
CATH FOLEY 2WAY  5CC 16FR (CATHETERS) ×2
CATH URTH 16FR FL 2W BLN LF (CATHETERS) ×2 IMPLANT
CHLORAPREP W/TINT 26ML (MISCELLANEOUS) ×4 IMPLANT
CLOSURE WOUND 1/4X4 (GAUZE/BANDAGES/DRESSINGS) ×1
DERMABOND ADVANCED (GAUZE/BANDAGES/DRESSINGS) ×2
DERMABOND ADVANCED .7 DNX12 (GAUZE/BANDAGES/DRESSINGS) ×2 IMPLANT
DRSG TEGADERM 2-3/8X2-3/4 SM (GAUZE/BANDAGES/DRESSINGS) ×12 IMPLANT
GAUZE SPONGE NON-WVN 2X2 STRL (MISCELLANEOUS) ×4 IMPLANT
GLOVE BIO SURGEON STRL SZ7 (GLOVE) ×8 IMPLANT
GLOVE INDICATOR 7.5 STRL GRN (GLOVE) ×4 IMPLANT
GOWN STRL REUS W/ TWL LRG LVL3 (GOWN DISPOSABLE) ×4 IMPLANT
GOWN STRL REUS W/TWL LRG LVL3 (GOWN DISPOSABLE) ×4
IRRIGATION STRYKERFLOW (MISCELLANEOUS) ×2 IMPLANT
IRRIGATOR STRYKERFLOW (MISCELLANEOUS) ×4
IV NS 1000ML (IV SOLUTION) ×2
IV NS 1000ML BAXH (IV SOLUTION) ×2 IMPLANT
KIT PINK PAD W/HEAD ARE REST (MISCELLANEOUS) ×4
KIT PINK PAD W/HEAD ARM REST (MISCELLANEOUS) ×2 IMPLANT
KIT RM TURNOVER CYSTO AR (KITS) ×4 IMPLANT
LABEL OR SOLS (LABEL) ×4 IMPLANT
LIGASURE VESSEL 5MM BLUNT TIP (ELECTROSURGICAL) ×4 IMPLANT
NS IRRIG 500ML POUR BTL (IV SOLUTION) ×4 IMPLANT
PACK GYN LAPAROSCOPIC (MISCELLANEOUS) ×4 IMPLANT
PAD OB MATERNITY 4.3X12.25 (PERSONAL CARE ITEMS) ×4 IMPLANT
PAD PREP 24X41 OB/GYN DISP (PERSONAL CARE ITEMS) ×4 IMPLANT
POUCH SPECIMEN RETRIEVAL 10MM (ENDOMECHANICALS) IMPLANT
SCISSORS METZENBAUM CVD 33 (INSTRUMENTS) ×4 IMPLANT
SLEEVE ENDOPATH XCEL 5M (ENDOMECHANICALS) ×4 IMPLANT
SLEEVE PROTECTION STRL DISP (MISCELLANEOUS) ×4 IMPLANT
SPONGE VERSALON 2X2 STRL (MISCELLANEOUS) ×4
STRIP CLOSURE SKIN 1/4X4 (GAUZE/BANDAGES/DRESSINGS) ×3 IMPLANT
SUT MNCRL AB 4-0 PS2 18 (SUTURE) ×4 IMPLANT
SUT VIC AB 2-0 UR6 27 (SUTURE) ×4 IMPLANT
SUT VIC AB 4-0 SH 27 (SUTURE) ×2
SUT VIC AB 4-0 SH 27XANBCTRL (SUTURE) ×2 IMPLANT
SWABSTK COMLB BENZOIN TINCTURE (MISCELLANEOUS) ×4 IMPLANT
TROCAR XCEL NON-BLD 5MMX100MML (ENDOMECHANICALS) ×4 IMPLANT
TUBING INSUFFLATION (TUBING) ×4 IMPLANT

## 2017-04-30 NOTE — Discharge Instructions (Signed)
AMBULATORY SURGERY  DISCHARGE INSTRUCTIONS   1) The drugs that you were given will stay in your system until tomorrow so for the next 24 hours you should not:  A) Drive an automobile B) Make any legal decisions C) Drink any alcoholic beverage   2) You may resume regular meals tomorrow.  Today it is better to start with liquids and gradually work up to solid foods.  You may eat anything you prefer, but it is better to start with liquids, then soup and crackers, and gradually work up to solid foods.   3) Please notify your doctor immediately if you have any unusual bleeding, trouble breathing, redness and pain at the surgery site, drainage, fever, or pain not relieved by medication.    4) Additional Instructions:        Please contact your physician with any problems or Same Day Surgery at [phone], Monday through Friday 6 am to 4 pm, or [place] at [facility] number at [phone].      Laparoscopic Ovarian Surgery Discharge Instructions  For the next three days, take ibuprofen and acetaminophen on a schedule, every 8 hours. You can take them together or you can intersperse them, and take one every four hours. I also gave you gabapentin for nighttime, to help you sleep and also to control pain. Take gabapentin medicines at night for at least the next 3 nights. You also have a narcotic, oxycodone, to take as needed if the above medicines don't help.  Postop constipation is a major cause of pain. Stay well hydrated, walk as you tolerate, and take over the counter senna as well as stool softeners if you need them.   RISKS AND COMPLICATIONS   Infection.  Bleeding.  Injury to surrounding organs.  Anesthetic side effects.   PROCEDURE   You may be given a medicine to help you relax (sedative) before the procedure. You will be given a medicine to make you sleep (general anesthetic) during the procedure.  A tube will be put down your throat to help your breath  while under general anesthesia.  Several small cuts (incisions) are made in the lower abdominal area and one incision is made near the belly button.  Your abdominal area will be inflated with a safe gas (carbon dioxide). This helps give the surgeon room to operate, visualize, and helps the surgeon avoid other organs.  A thin, lighted tube (laparoscope) with a camera attached is inserted into your abdomen through the incision near the belly button. Other small instruments may also be inserted through other abdominal incisions.  The ovary is located and are removed.  After the ovary is removed, the gas is released from the abdomen.  The incisions will be closed with stitches (sutures), and Dermabond. A bandage may be placed over the incisions.  AFTER THE PROCEDURE   You will also have some mild abdominal discomfort for 3-7 days. You will be given pain medicine to ease any discomfort.  As long as there are no problems, you may be allowed to go home. Someone will need to drive you home and be with you for at least 24 hours once home.  You may have some mild discomfort in the throat. This is from the tube placed in your throat while you were sleeping.  You may experience discomfort in the shoulder area from some trapped air between the liver and diaphragm. This sensation is normal and will slowly go away on its own.  HOME CARE INSTRUCTIONS  Take all medicines as directed.  Only take over-the-counter or prescription medicines for pain, discomfort, or fever as directed by your caregiver.  Resume daily activities as directed.  Showers are preferred over baths for 2 weeks.  You may resume sexual activities in 1 week or as you feel you would like to.  Do not drive while taking narcotics.  SEEK MEDICAL CARE IF: .  There is increasing abdominal pain.  You feel lightheaded or faint.  You have the chills.  You have an oral temperature above 102 F (38.9 C).  There is pus-like  (purulent) drainage from any of the wounds.  You are unable to pass gas or have a bowel movement.  You feel sick to your stomach (nauseous) or throw up (vomit) and can't control it with your medicines.  MAKE SURE YOU:   Understand these instructions.  Will watch your condition.  Will get help right away if you are not doing well or get worse.  ExitCare Patient Information 2013 [place].     Here is a helpful article from the website [email or web address], regarding constipation  Here are reasons why constipation occurs after surgery: 1) During the operation and in the recovery room, most people are given opioid pain medication, primarily through an IV, to treat moderate or severe pain. Intravenous opioids include morphine, Dilaudid and fentanyl. After surgery, patients are often prescribed opioid pain medication to take by mouth at home, including codeine, Vicodin, Norco, and Percocet. All of these medications cause constipation by slowing down the movement of your intestine. 2) Changes in your diet before surgery can be another culprit. It is common to get specific instructions to change how you normally eat or drink before your surgery, like only having liquids the day before or not having anything to eat or drink after midnight the night before surgery. For this reason, temporary dehydration may occur. This, along with not eating or only having liquids, means that you are getting less fiber than usual. Both these factors contribute to constipation. 3) Changes in your diet after surgery can also contribute to the problem. Although many people dont have dietary restrictions after operations, being under anesthesia can make you lose your appetite for several hours and maybe even days. Some people can even have nausea or vomiting. Not eating or drinking normally means that you are not getting enough fiber and you can get dehydrated, both leading to constipation. 4) Lying in a bed more than  usual--which happens before, during and after surgery--combined with the medications and diet changes, all work together to slow down your colon and make your poop turn to rock.  No one likes to be constipated.  Lets face it, its not a pleasant feeling when you dont poop for days, then strain on the toilet to finally pass something large enough to cause damage. An ounce of prevention is worth a pound of cure, so: 1. Assume you will be constipated. 2. Plan and prepare accordingly. Post-surgery is one of those unique situations where the temporary use of laxatives can make a world of difference. Always consult with your doctor, and recognize that if you wait several days after surgery to take a laxative, the constipation might be too severe for these over-the-counter options. It is always important to discuss all medications you plan on taking with your doctor. Ask your doctor if you can start the laxative immediately after surgery. *  Here are go-to post-surgery laxatives: Senna: Senna is an herb that acts as a  stimulant laxative, meaning it increases the activity of the intestine to cause you to have a bowel movement. It comes in many forms, but senna pills are easy to take and are sold over the counter at almost all pharmacies. Since opioid pain medications slow down the activity of the intestine, it makes sense to take a medication to help reverse that side effect. Long-term use of a stimulant laxative is not a good idea since it can make your colon lazy and not function properly; however, temporary use immediately after surgery is acceptable. In general, if you are able to eat a normal diet, taking senna soon after surgery works the best. Senna usually works within hours to produce a bowel movement, but this is less predictable when you are taking different medications after surgery. Try not to wait several days to start taking senna, as often it is too late by then. Just like with all medications  or supplements, check with your doctor before starting new treatment.   Magnesium: Magnesium is an important mineral that our body needs. We get magnesium from some foods that we eat, especially foods that are high in fiber such as broccoli, almonds and whole grains. There are also magnesium-based medications used to treat constipation including milk of magnesia (magnesium hydroxide), magnesium citrate and magnesium oxide. They work by drawing water into the intestine, putting it into the class of osmotic laxatives. Magnesium products in low doses appear to be safe, but if taken in very large doses, can lead to problems such as irregular heartbeat, low blood pressure and even death. It can also affect other medications you might be taking, therefore it is important to discuss using magnesium with your physician and pharmacist before initiating therapy. Most over-the-counter magnesium laxatives work very well to help with the constipation related to surgery, but sometimes they work too well and lead to diarrhea. Make sure you are somewhere with easy access to a bathroom, just in case.   Bisacodyl: Bisacodyl (generic name) is sold under brand names such as Dulcolax. Much like senna, it is a stimulant laxative, meaning it makes your intestines move more quickly to push out the stool. This is another good choice to start taking as soon as your doctor says you can take a laxative after surgery. It comes in pill form and as a suppository, which is a good choice for people who cannot or are not allowed to swallow pills. Studies have shown that it works as a laxative, but like most of these medications, you should use this on a short-term basis only.   Enema: Enemas strike fear in many people, but FEAR NOT! Its nowhere near as big a deal as you may think. An enema is just a way to get some liquid into your rectum by placing a specially designed device through your anus. If you have never done one, it might seem  like a painful, unpleasant, uncomfortable, complicated and lengthy procedure. But in reality, its simple, takes just a few seconds and is highly effective. The small ready-made bottles you buy at the pharmacy are much easier than the hose/large rubber container type. Those recommended positions illustrated in some instructions are generally not necessary to place the enema. Its very similar to the insertion of a tampon, requiring a slight squat. Some extra lubrication on the enemas tip (or on your anus) will make it a breeze. In certain cases, there is no substitute for a good enema. For example, if someone has not pooped for  a few days, the beginning of the poop waiting to come out can become rock hard. Passing that hard stool can lead to much pain and problems like anal fissures. Inserting a little liquid to break up the rock-hard stool will help make its passage much easier. Enemas come with different liquids. Most come with saline, but there are also mineral oil options. You can also use warm water in the reusable enema containers. They all work. But since saline can sometimes be irritating, so try a mineral oil or water enema instead.  Here are commonly recommended constipation medications that do not work well for post-surgery constipation: Docusate: Docusate (generic name) most commonly referred to as Colace (brand name) is not really a laxative, but is classified as a stool softener. Although this medication is commonly prescribed, it is not recommended for several reasons: 1) there is no good medical evidence that it works 2) even if it has an effect, which is very questionable, it is minimal and cannot combat the intestinal slowing caused by the opioid medications. Skip docusate to save money and space in your pillbox for something more effective.  PEG: Miralax (brand name) is basically a chemical called polyethylene glycol (PEG) and it has gained tremendous popularity as a laxative. This product  is an osmotic laxative meaning it works by pulling water into the stool, making it softer. This is very similar to the action of natural fiber in foods and supplements. Therefore, the effect seen by this medication is not immediate, causing a bowel movement in a day or more. Is this medication strong enough to battle the constipation related to having an operation? Maybe for some people not prone to constipation. But for most people, other laxatives are better to prevent constipation after surgery.

## 2017-04-30 NOTE — Interval H&P Note (Signed)
History and Physical Interval Note:  04/30/2017 2:59 PM  Dana RegulusJennifer D Cawood  has presented today for surgery, with the diagnosis of right ovarian cyst, pelvic pain  The various methods of treatment have been discussed with the patient and family. After consideration of risks, benefits and other options for treatment, the patient has consented to  Procedure(s): LAPAROSCOPY DIAGNOSTIC (N/A) LAPAROSCOPIC OVARIAN CYSTECTOMY (Right) LAPAROSCOPIC OOPHORECTOMY (Right) as a surgical intervention .  The patient's history has been reviewed, patient examined, no change in status, stable for surgery.  I have reviewed the patient's chart and labs.  Questions were answered to the patient's satisfaction.     Christeen DouglasBethany Antoneo Ghrist

## 2017-04-30 NOTE — Transfer of Care (Addendum)
Immediate Anesthesia Transfer of Care Note  Patient: Dana RegulusJennifer D Wrenn  Procedure(s) Performed: LAPAROSCOPY DIAGNOSTIC (N/A ) LAPAROSCOPIC OVARIAN CYSTECTOMY (Right )  Patient Location: PACU  Anesthesia Type:General  Level of Consciousness: awake, alert  and oriented  Airway & Oxygen Therapy: Patient Spontanous Breathing and Patient connected to face mask oxygen  Post-op Assessment: Report given to RN and Post -op Vital signs reviewed and stable  Post vital signs: Reviewed and stable  Last Vitals:  Vitals:   04/30/17 1511 04/30/17 1519  BP: 136/75   Pulse: 79   Resp: 15   Temp:  37 C  SpO2: 100%     Last Pain:  Vitals:   04/30/17 1628  TempSrc:   PainSc: 8       Patients Stated Pain Goal: 2 (04/30/17 1511)  Complications: No apparent anesthesia complications

## 2017-04-30 NOTE — Anesthesia Procedure Notes (Signed)
Procedure Name: Intubation Date/Time: 04/30/2017 6:47 PM Performed by: Ginger CarneMichelet, Eunique Balik, CRNA Pre-anesthesia Checklist: Patient identified, Emergency Drugs available, Suction available, Patient being monitored and Timeout performed Patient Re-evaluated:Patient Re-evaluated prior to induction Preoxygenation: Pre-oxygenation with 100% oxygen Induction Type: IV induction Ventilation: Mask ventilation without difficulty Laryngoscope Size: Miller and 2 Grade View: Grade I Tube type: Oral Tube size: 7.0 mm Number of attempts: 1 Airway Equipment and Method: Stylet Placement Confirmation: ETT inserted through vocal cords under direct vision,  positive ETCO2 and breath sounds checked- equal and bilateral Secured at: 22 cm Tube secured with: Tape Dental Injury: Teeth and Oropharynx as per pre-operative assessment

## 2017-04-30 NOTE — Anesthesia Post-op Follow-up Note (Signed)
Anesthesia QCDR form completed.        

## 2017-04-30 NOTE — Op Note (Signed)
Dana RegulusJennifer D Rammel PROCEDURE DATE: 04/30/2017  PREOPERATIVE DIAGNOSIS: Acute on chronic pelvic pain POSTOPERATIVE DIAGNOSIS: Right ovarian cyst PROCEDURE: Diagnostic laparoscopy, right ovarian cystectomy SURGEON:  Dr. Christeen DouglasBethany Dejanay Wamboldt ASSISTANT: Dr. Leeroy Bockhelsea Ward ANESTHESIOLOGIST: Yevette EdwardsAdams, James G, MD Anesthesiologist: Yevette EdwardsAdams, James G, MD CRNA: Ginger CarneMichelet, Stephanie, CRNA  INDICATIONS: 37 y.o. with history of chronic on acute pelvic pain with a hx of ovarian cysts and a 4cm simple cyst on right ovary. Please see preoperative notes for further details.  Risks of surgery were discussed with the patient including but not limited to: bleeding which may require transfusion or reoperation; infection which may require antibiotics; injury to bowel, bladder, ureters or other surrounding organs; need for additional procedures including laparotomy; thromboembolic phenomenon, incisional problems and other postoperative/anesthesia complications. Written informed consent was obtained.    FINDINGS:  Small uterus, normal ovaries and fallopian tubes bilaterally. No other abdominal/pelvic abnormality.  Normal upper abdomen.  ANESTHESIA:    General INTRAVENOUS FLUIDS: 1000 ml ESTIMATED BLOOD LOSS: 40 ml URINE OUTPUT: 25 ml SPECIMENS: Right ovary with right ovarian cyst COMPLICATIONS: None immediate  PROCEDURE IN DETAIL:  The patient had sequential compression devices applied to her lower extremities while in the preoperative area.  She was then taken to the operating room where general anesthesia was administered and was found to be adequate.  She was placed in the dorsal lithotomy position, and was prepped and draped in a sterile manner.  A Foley catheter was inserted into her bladder and attached to constant drainage and a uterine manipulator was then advanced into the uterus .  After an adequate timeout was performed, attention was turned to the abdomen where an umbilical incision was made with the scalpel.  The  Optiview 5-mm trocar and sleeve were then advanced without difficulty with the laparoscope under direct visualization into the abdomen.  The abdomen was then insufflated with carbon dioxide gas and adequate pneumoperitoneum was obtained.   A detailed survey of the patient's pelvis and abdomen revealed the findings as mentioned above.  An additional 5mm trocar was placed in the bilaterallower quadrants under direct visualization, and at 5mm trocar was placed in the opposite quadrant under direct visualization.   Evaluation of the pelvic sidewalls to observe the course of the ureters was undertaken, assuring the ureter was outside the operative field.   The ovarian cyst was evaluated, and Ligasure used to remove a large section of the ovary at the cyst; a small rind of normal ovarian tissue remained. Hemostasis was assured with electrocautery. The pelvis was irrigated and all fluid and blood removed. The patient was stable throughout.  The operative site was surveyed, and it was found to be hemostatic.  No intraoperative injury to surrounding organs was noted.   Pictures were taken of the quadrants and pelvis. The abdomen was desufflated and all instruments were then removed from the patient's abdomen. The uterine manipulator was removed without complications.  All incisions were closed with 4-0 Vicryl and Dermabond.   The patient tolerated the procedures well.  All instruments, needles, and sponge counts were correct x 2. The patient was taken to the recovery room in stable condition.

## 2017-04-30 NOTE — Anesthesia Preprocedure Evaluation (Signed)
Anesthesia Evaluation  Patient identified by MRN, date of birth, ID band Patient awake    Reviewed: Allergy & Precautions, H&P , NPO status , Patient's Chart, lab work & pertinent test results, reviewed documented beta blocker date and time   Airway Mallampati: II  TM Distance: >3 FB Neck ROM: full    Dental  (+) Teeth Intact   Pulmonary neg pulmonary ROS,    Pulmonary exam normal        Cardiovascular negative cardio ROS Normal cardiovascular exam Rhythm:regular Rate:Normal     Neuro/Psych negative neurological ROS  negative psych ROS   GI/Hepatic negative GI ROS, Neg liver ROS,   Endo/Other  negative endocrine ROS  Renal/GU negative Renal ROS  negative genitourinary   Musculoskeletal   Abdominal   Peds  Hematology negative hematology ROS (+)   Anesthesia Other Findings History reviewed. No pertinent past medical history. Past Surgical History: 01/11/2017: LAPAROSCOPIC OVARIAN CYSTECTOMY; Bilateral     Comment:  Procedure: LAPAROSCOPIC OVARIAN CYSTECTOMY;  Surgeon:               Christeen DouglasBeasley, Bethany, MD;  Location: [facility] ORS;  Service:               Gynecology;  Laterality: Bilateral; No date: TENDON REPAIR; Right     Comment:  pinky finger BMI    Body Mass Index:  24.80 kg/m     Reproductive/Obstetrics negative OB ROS                             Anesthesia Physical Anesthesia Plan  ASA: II  Anesthesia Plan: General ETT   Post-op Pain Management:    Induction:   PONV Risk Score and Plan:   Airway Management Planned:   Additional Equipment:   Intra-op Plan:   Post-operative Plan:   Informed Consent: I have reviewed the patients History and Physical, chart, labs and discussed the procedure including the risks, benefits and alternatives for the proposed anesthesia with the patient or authorized representative who has indicated his/her understanding and acceptance.   Dental  Advisory Given  Plan Discussed with: CRNA  Anesthesia Plan Comments:         Anesthesia Quick Evaluation

## 2017-05-02 NOTE — Anesthesia Postprocedure Evaluation (Signed)
Anesthesia Post Note  Patient: Illene RegulusJennifer D Mesa  Procedure(s) Performed: LAPAROSCOPY DIAGNOSTIC (N/A ) LAPAROSCOPIC OVARIAN CYSTECTOMY (Right )  Patient location during evaluation: PACU Anesthesia Type: General Level of consciousness: awake and alert Pain management: pain level controlled Vital Signs Assessment: post-procedure vital signs reviewed and stable Respiratory status: spontaneous breathing, nonlabored ventilation, respiratory function stable and patient connected to nasal cannula oxygen Cardiovascular status: blood pressure returned to baseline and stable Postop Assessment: no apparent nausea or vomiting Anesthetic complications: no     Last Vitals:  Vitals:   04/30/17 2120 04/30/17 2147  BP: 122/68 (!) 111/53  Pulse: 79 68  Resp: 15 16  Temp: 36.4 C 36.7 C  SpO2: 100% 98%    Last Pain:  Vitals:   04/30/17 2147  TempSrc: (P) Tympanic  PainSc: 2                  Yevette EdwardsJames G Adams

## 2017-05-03 ENCOUNTER — Encounter: Payer: Self-pay | Admitting: Obstetrics and Gynecology

## 2017-05-04 LAB — SURGICAL PATHOLOGY

## 2017-05-28 ENCOUNTER — Encounter
Admission: RE | Disposition: A | Discharge status: Home / Self Care | Payer: Self-pay | Source: Ambulatory Visit | Attending: Obstetrics and Gynecology

## 2017-05-28 ENCOUNTER — Other Ambulatory Visit: Payer: Self-pay

## 2017-05-28 ENCOUNTER — Ambulatory Visit: Payer: Managed Care, Other (non HMO) | Admitting: Anesthesiology

## 2017-05-28 ENCOUNTER — Ambulatory Visit
Admission: RE | Admit: 2017-05-28 | Discharge: 2017-05-28 | Disposition: A | Discharge status: Home / Self Care | Payer: Managed Care, Other (non HMO) | Source: Ambulatory Visit | Attending: Obstetrics and Gynecology | Admitting: Obstetrics and Gynecology

## 2017-05-28 DIAGNOSIS — N8302 Follicular cyst of left ovary: Secondary | ICD-10-CM | POA: Insufficient documentation

## 2017-05-28 DIAGNOSIS — Z888 Allergy status to other drugs, medicaments and biological substances status: Secondary | ICD-10-CM | POA: Insufficient documentation

## 2017-05-28 DIAGNOSIS — Z79899 Other long term (current) drug therapy: Secondary | ICD-10-CM | POA: Insufficient documentation

## 2017-05-28 DIAGNOSIS — R102 Pelvic and perineal pain: Secondary | ICD-10-CM | POA: Diagnosis present

## 2017-05-28 HISTORY — PX: LAPAROSCOPY: SHX197

## 2017-05-28 HISTORY — PX: OVARIAN CYST REMOVAL: SHX89

## 2017-05-28 LAB — POCT PREGNANCY, URINE: PREG TEST UR: NEGATIVE

## 2017-05-28 LAB — TYPE AND SCREEN
ABO/RH(D): O POS
Antibody Screen: NEGATIVE

## 2017-05-28 SURGERY — LAPAROSCOPY OPERATIVE
Anesthesia: General | Site: Abdomen | Wound class: Clean Contaminated

## 2017-05-28 MED ORDER — PHENYLEPHRINE HCL 10 MG/ML IJ SOLN
INTRAMUSCULAR | Status: DC | PRN
  Administered 2017-05-28: 100 ug via INTRAVENOUS

## 2017-05-28 MED ORDER — MEPERIDINE HCL 50 MG/ML IJ SOLN: 6.2500 mg | INTRAMUSCULAR | Status: DC | PRN

## 2017-05-28 MED ORDER — GABAPENTIN 800 MG PO TABS: 800.0000 mg | ORAL_TABLET | Freq: Every day | ORAL | 0 refills | Status: DC

## 2017-05-28 MED ORDER — LACTATED RINGERS IV SOLN
INTRAVENOUS | Status: DC
  Administered 2017-05-28: 10:00:00 via INTRAVENOUS

## 2017-05-28 MED ORDER — IBUPROFEN 800 MG PO TABS: 800.0000 mg | ORAL_TABLET | Freq: Three times a day (TID) | ORAL | 1 refills | Status: DC | PRN

## 2017-05-28 MED ORDER — ONDANSETRON HCL 4 MG/2ML IJ SOLN
INTRAMUSCULAR | Status: DC | PRN
  Administered 2017-05-28: 4 mg via INTRAVENOUS

## 2017-05-28 MED ORDER — ACETAMINOPHEN 500 MG PO TABS
ORAL_TABLET | ORAL | Status: AC
  Filled 2017-05-28: qty 2

## 2017-05-28 MED ORDER — KETOROLAC TROMETHAMINE 30 MG/ML IJ SOLN
INTRAMUSCULAR | Status: AC
  Filled 2017-05-28: qty 1

## 2017-05-28 MED ORDER — BUPIVACAINE HCL (PF) 0.5 % IJ SOLN
INTRAMUSCULAR | Status: AC
  Filled 2017-05-28: qty 30

## 2017-05-28 MED ORDER — OXYCODONE HCL 5 MG PO TABS
5.0000 mg | ORAL_TABLET | Freq: Once | ORAL | Status: AC | PRN
  Administered 2017-05-28: 5 mg via ORAL

## 2017-05-28 MED ORDER — PROMETHAZINE HCL 25 MG/ML IJ SOLN: 6.2500 mg | INTRAMUSCULAR | Status: DC | PRN

## 2017-05-28 MED ORDER — SUGAMMADEX SODIUM 200 MG/2ML IV SOLN
INTRAVENOUS | Status: DC | PRN
  Administered 2017-05-28: 127 mg via INTRAVENOUS

## 2017-05-28 MED ORDER — FENTANYL CITRATE (PF) 100 MCG/2ML IJ SOLN
INTRAMUSCULAR | Status: AC
  Administered 2017-05-28: 25 ug via INTRAVENOUS
  Filled 2017-05-28: qty 2

## 2017-05-28 MED ORDER — GLYCOPYRROLATE 0.2 MG/ML IJ SOLN
INTRAMUSCULAR | Status: DC | PRN
  Administered 2017-05-28: 0.2 mg via INTRAVENOUS

## 2017-05-28 MED ORDER — MIDAZOLAM HCL 2 MG/2ML IJ SOLN
INTRAMUSCULAR | Status: AC
  Filled 2017-05-28: qty 2

## 2017-05-28 MED ORDER — ACETAMINOPHEN 500 MG PO TABS
1000.0000 mg | ORAL_TABLET | ORAL | Status: AC
  Administered 2017-05-28: 1000 mg via ORAL

## 2017-05-28 MED ORDER — ROCURONIUM BROMIDE 50 MG/5ML IV SOLN
INTRAVENOUS | Status: AC
  Filled 2017-05-28: qty 1

## 2017-05-28 MED ORDER — MIDAZOLAM HCL 2 MG/2ML IJ SOLN
INTRAMUSCULAR | Status: DC | PRN
  Administered 2017-05-28: 2 mg via INTRAVENOUS

## 2017-05-28 MED ORDER — DOCUSATE SODIUM 100 MG PO CAPS: 100.0000 mg | ORAL_CAPSULE | Freq: Two times a day (BID) | ORAL | 0 refills | Status: DC

## 2017-05-28 MED ORDER — BUPIVACAINE HCL 0.5 % IJ SOLN
INTRAMUSCULAR | Status: DC | PRN
  Administered 2017-05-28: 10 mL

## 2017-05-28 MED ORDER — OXYCODONE HCL 5 MG PO TABS
ORAL_TABLET | ORAL | Status: AC
  Filled 2017-05-28: qty 1

## 2017-05-28 MED ORDER — GABAPENTIN 300 MG PO CAPS
ORAL_CAPSULE | ORAL | Status: AC
  Filled 2017-05-28: qty 3

## 2017-05-28 MED ORDER — GABAPENTIN 300 MG PO CAPS
900.0000 mg | ORAL_CAPSULE | ORAL | Status: AC
  Administered 2017-05-28: 900 mg via ORAL

## 2017-05-28 MED ORDER — FENTANYL CITRATE (PF) 100 MCG/2ML IJ SOLN
INTRAMUSCULAR | Status: AC
  Filled 2017-05-28: qty 2

## 2017-05-28 MED ORDER — LACTATED RINGERS IV SOLN
INTRAVENOUS | Status: DC
  Administered 2017-05-28: 13:00:00 via INTRAVENOUS

## 2017-05-28 MED ORDER — FENTANYL CITRATE (PF) 100 MCG/2ML IJ SOLN
INTRAMUSCULAR | Status: DC | PRN
  Administered 2017-05-28: 100 ug via INTRAVENOUS
  Administered 2017-05-28 (×2): 50 ug via INTRAVENOUS

## 2017-05-28 MED ORDER — ACETAMINOPHEN 500 MG PO TABS: 1000.0000 mg | ORAL_TABLET | Freq: Four times a day (QID) | ORAL | 0 refills | Status: AC

## 2017-05-28 MED ORDER — OXYCODONE HCL 5 MG PO CAPS: 5.0000 mg | ORAL_CAPSULE | Freq: Four times a day (QID) | ORAL | 0 refills | Status: DC | PRN

## 2017-05-28 MED ORDER — PROPOFOL 10 MG/ML IV BOLUS
INTRAVENOUS | Status: AC
  Filled 2017-05-28: qty 20

## 2017-05-28 MED ORDER — LIDOCAINE HCL (CARDIAC) 20 MG/ML IV SOLN
INTRAVENOUS | Status: DC | PRN
  Administered 2017-05-28: 100 mg via INTRAVENOUS

## 2017-05-28 MED ORDER — LIDOCAINE HCL (PF) 2 % IJ SOLN
INTRAMUSCULAR | Status: AC
  Filled 2017-05-28: qty 10

## 2017-05-28 MED ORDER — METHYLENE BLUE 1 % INJ SOLN
INTRAMUSCULAR | Status: AC
  Filled 2017-05-28: qty 10

## 2017-05-28 MED ORDER — KETOROLAC TROMETHAMINE 30 MG/ML IJ SOLN
INTRAMUSCULAR | Status: DC | PRN
  Administered 2017-05-28: 30 mg via INTRAVENOUS

## 2017-05-28 MED ORDER — LACTATED RINGERS IV SOLN: INTRAVENOUS | Status: DC

## 2017-05-28 MED ORDER — FENTANYL CITRATE (PF) 100 MCG/2ML IJ SOLN
25.0000 ug | INTRAMUSCULAR | Status: DC | PRN
  Administered 2017-05-28 (×4): 25 ug via INTRAVENOUS

## 2017-05-28 MED ORDER — DEXAMETHASONE SODIUM PHOSPHATE 10 MG/ML IJ SOLN
INTRAMUSCULAR | Status: DC | PRN
  Administered 2017-05-28: 10 mg via INTRAVENOUS

## 2017-05-28 MED ORDER — OXYCODONE HCL 5 MG/5ML PO SOLN: 5.0000 mg | Freq: Once | ORAL | Status: AC | PRN

## 2017-05-28 MED ORDER — PROPOFOL 10 MG/ML IV BOLUS
INTRAVENOUS | Status: DC | PRN
  Administered 2017-05-28: 150 mg via INTRAVENOUS

## 2017-05-28 MED ORDER — ROCURONIUM BROMIDE 100 MG/10ML IV SOLN
INTRAVENOUS | Status: DC | PRN
  Administered 2017-05-28: 30 mg via INTRAVENOUS

## 2017-05-28 SURGICAL SUPPLY — 35 items
BAG URINE DRAINAGE (UROLOGICAL SUPPLIES) IMPLANT
BLADE SURG SZ11 CARB STEEL (BLADE) ×5 IMPLANT
CATH FOLEY 2WAY  5CC 16FR (CATHETERS) ×2
CATH URTH 16FR FL 2W BLN LF (CATHETERS) ×3 IMPLANT
CHLORAPREP W/TINT 26ML (MISCELLANEOUS) ×5 IMPLANT
CLOSURE WOUND 1/4X4 (GAUZE/BANDAGES/DRESSINGS) ×1
DERMABOND ADVANCED (GAUZE/BANDAGES/DRESSINGS) ×2
DERMABOND ADVANCED .7 DNX12 (GAUZE/BANDAGES/DRESSINGS) ×3 IMPLANT
GLOVE BIO SURGEON STRL SZ7 (GLOVE) ×10 IMPLANT
GLOVE INDICATOR 7.5 STRL GRN (GLOVE) ×5 IMPLANT
GOWN STRL REUS W/ TWL LRG LVL3 (GOWN DISPOSABLE) ×6 IMPLANT
GOWN STRL REUS W/TWL LRG LVL3 (GOWN DISPOSABLE) ×4
IRRIGATION STRYKERFLOW (MISCELLANEOUS) ×3 IMPLANT
IRRIGATOR STRYKERFLOW (MISCELLANEOUS) ×5
IV NS 1000ML (IV SOLUTION) ×2
IV NS 1000ML BAXH (IV SOLUTION) ×3 IMPLANT
KIT PINK PAD W/HEAD ARE REST (MISCELLANEOUS) ×5
KIT PINK PAD W/HEAD ARM REST (MISCELLANEOUS) ×3 IMPLANT
KIT TURNOVER CYSTO (KITS) ×5 IMPLANT
LABEL OR SOLS (LABEL) ×5 IMPLANT
LIGASURE VESSEL 5MM BLUNT TIP (ELECTROSURGICAL) ×5 IMPLANT
NS IRRIG 500ML POUR BTL (IV SOLUTION) ×5 IMPLANT
PACK GYN LAPAROSCOPIC (MISCELLANEOUS) ×5 IMPLANT
PAD OB MATERNITY 4.3X12.25 (PERSONAL CARE ITEMS) ×5 IMPLANT
PAD PREP 24X41 OB/GYN DISP (PERSONAL CARE ITEMS) ×5 IMPLANT
POUCH SPECIMEN RETRIEVAL 10MM (ENDOMECHANICALS) IMPLANT
SCISSORS METZENBAUM CVD 33 (INSTRUMENTS) ×5 IMPLANT
SLEEVE ENDOPATH XCEL 5M (ENDOMECHANICALS) ×5 IMPLANT
STRIP CLOSURE SKIN 1/4X4 (GAUZE/BANDAGES/DRESSINGS) ×4 IMPLANT
SUT MNCRL AB 4-0 PS2 18 (SUTURE) ×5 IMPLANT
SUT VIC AB 2-0 UR6 27 (SUTURE) ×5 IMPLANT
SUT VIC AB 4-0 SH 27 (SUTURE) ×2
SUT VIC AB 4-0 SH 27XANBCTRL (SUTURE) ×3 IMPLANT
TROCAR XCEL NON-BLD 5MMX100MML (ENDOMECHANICALS) ×5 IMPLANT
TUBING INSUFFLATION (TUBING) ×5 IMPLANT

## 2017-05-28 NOTE — H&P (Signed)
Patient ID: Dana RegulusJennifer D Baldwin is a 37 y.o. female presenting with Pre Op Consulting  on 05/25/2017  HPI: S/p dx lap for ruptured hemorrhagic cyst in 12/2016, with blood in pelvis. We placed IUD. She started having similar returning sx and another cyst was found.  04/30/17  - repeat dx lap for right ovarian cystectomy with a portion of right ovary removed. I started ovarian suppression with 35mcg OCPs after her first surgery but   Pain returned, and   04/22/17 u/s found IUD in fundus but RIGHT simple and complex cysts, largest measuring 4cm.  Worsening pain led to a  05/02/17 tvus with 6cm cyst on the LEFT, which is where her current pain localizes.  In left lower back significantly. Difficult to sit. Disturbing sleep. No hematuria, bowel sx. +bladder pain when fully emptying bladder.  She has requested bilateral oophorectomy, but I have asked her to consider a 50mcg pill first. If she fails that again, I would consider oophorectomy after Lupron trial and after second opinion.  I referred her to PFPT 05/18/17 I sent in Elmiron, as some sx are bladder related. She has not started the OCPs  Her estrogen levels WERE high at last visit -822  Component     Latest Ref Rng & Units 01/19/2017 05/21/2017  WBC     4.1 - 10.2 10^3/uL 6.7   RBC     4.04 - 5.48 10^6/uL 4.15   Hemoglobin     12.0 - 15.0 gm/dL 16.113.1   Hematocrit     09.635.0 - 47.0 % 37.2   MCV     80.0 - 100.0 fl 89.6   MCH (Mean Corpuscular Hemoglobin)     27.0 - 31.2 pg 31.6 (H)   MCHC     32.0 - 36.0 gm/dL 04.535.2   Platelet Count     150 - 450 10^3/uL 289   RDW-CV (Red Cell Distribution Width)     11.6 - 14.8 % 12.0   MPV     9.4 - 12.4 fl 10.5   Neutrophils     1.50 - 7.80 10^3/uL 4.25   Lymphocyte Count     1.00 - 3.60 10^3/uL 1.68   Monocyte Count     0.00 - 1.50 10^3/uL 0.42   Eosinophils     0.00 - 0.55 10^3/uL 0.28   Basophils     0.00 - 0.09 10^3/uL 0.04   Neutrophil %     32.0 - 70.0 %  63.4   Lymphocyte %     10.0 - 50.0 % 25.1   Monocyte %     4.0 - 13.0 % 6.3   Eosinophil %     1.0 - 5.0 % 4.2   Basophil%     0.0 - 2.0 % 0.6   Immature Granulocyte %     <=0.7 % 0.4   Immature Granulocyte Count     <=0.06 10:3/L 0.03   Color, UA     Yellow, Straw, Blue Yellow   Clarity, UA     Clear, Slightly Cloudy, Turbid Other Clear   Specific Gravity     1.000 - 1.030 >=1.030   pH, Urine     5.0 - 8.0 5.5   Protein, UA     Negative, Trace mg/dL Negative   Glucose, UA     Negative mg/dL Negative   Ketones, UA     Negative mg/dL Negative   Blood, UA     Negative Moderate (A)   Nitrite, UA  Negative Negative   Leukocytes, UA     Negative Negative   WBC, UA     None Seen, 0-3 /hpf None Seen   RBC, UA     None Seen, 0-3 /hpf 4-10 (A)   Bacteria, UA     None Seen /hpf Rare (A)   Squam Epithel, UA     Rare, Few, None Seen /hpf Rare   LH - [facility]     mIU/mL  4.4  FSH - [facility]     mIU/mL  2.1  Estradiol - [facility]     pg/mL  822.1     Past Medical History:  has a past medical history of Allergic rhinitis, Other nonspecific abnormal finding, and Ovarian cyst.  Past Surgical History:  has a past surgical history that includes repair extensor tendon finger and Laparoscopic ovarian cystectomy. Family History: family history includes Hereditary spherocytosis in her father and sister; High blood pressure (Hypertension) in her father. Social History:  reports that she has never smoked. She has never used smokeless tobacco. She reports that she drinks alcohol. She reports that she does not use drugs. OB/GYN History:          OB History    Gravida  2   Para  2   Term  2   Preterm      AB      Living  2     SAB      TAB      Ectopic      Molar      Multiple      Live Births  2          Allergies: is allergic to mucinex [guaifenesin] and sudafed [pseudoephedrine hcl]. Medications:  Current Outpatient  Medications:  .  acetaminophen (TYLENOL) 500 mg capsule, Take 500 mg by mouth as needed for Fever., Disp: , Rfl:  .  gabapentin (NEURONTIN) 800 MG tablet, Take 1 tablet (800 mg total) by mouth nightly Increase to twice a day if desired, Disp: 90 tablet, Rfl: 3 .  levonorgestrel (MIRENA) 20 mcg/24 hr (5 years) IUD, Insert 1 each into the uterus once. Follow package directions., Disp: , Rfl:  .  norgestrel-ethinyl estradiol (OGESTREL) 0.5-50 mg-mcg tablet, Take 1 tablet by mouth once daily, Disp: 3 Package, Rfl: 4 .  oxyCODONE-acetaminophen (PERCOCET) 5-325 mg tablet, Take 1 tablet by mouth every 4 (four) hours as needed for Pain. (Patient not taking: Reported on 04/22/2017 ), Disp: 20 tablet, Rfl: 0 .  pentosan polysulfate (ELMIRON) 100 mg capsule, Take 1 capsule (100 mg total) by mouth 3 (three) times daily before meals, Disp: 90 capsule, Rfl: 3 .  sulfamethoxazole-trimethoprim (BACTRIM DS) 800-160 mg tablet, Take 1 tablet (160 mg of trimethoprim total) by mouth 2 (two) times daily. (Patient not taking: Reported on 04/22/2017 ), Disp: 6 tablet, Rfl: 0 .  traMADol (ULTRAM) 50 mg tablet, Take 1 tablet (50 mg total) by mouth once daily as needed for Pain, Disp: 15 tablet, Rfl: 1   Review of Systems: No SOB, no palpitations or chest pain, no new lower extremity edema, no nausea or vomiting or bowel or bladder complaints. See HPI for gyn specific ROS.   Exam:   BP 126/65   Pulse 72   General: Patient is well-groomed, well-nourished, appears stated age in no acute distress  HEENT: head is atraumatic and normocephalic, trachea is midline, neck is supple with no palpable nodules  CV: Regular rhythm and normal heart rate, no murmur  Pulm:  Clear to auscultation throughout lung fields with no wheezing, crackles, or rhonchi. No increased work of breathing  Abdomen: soft , no mass, non-tender, no rebound tenderness, no hepatomegaly  Pelvic: deferred  Impression:   The primary encounter  diagnosis was LLQ pain. Diagnoses of Cyst of ovary, left and Preop examination were also pertinent to this visit.    Plan:    Patient returns for a preoperative discussion regarding her plans to proceed with surgical treatment of her acute pelvic pain  by dx lap with left ovarian cystectomy procedure.I may possibly remove a cyst from the right ovary if one is there.   The patient and I discussed the technical aspects of the procedure including the potential for risks and complications. These include but are not limited to the risk of infection requiring post-operative antibiotics or further procedures. We talked about the risk of injury to adjacent organs including bladder, bowel, ureter, blood vessels or nerves. We talked about the need to convert to an open incision. We talked about the possible need for blood transfusion. We talked aboutpostop complications such asthromboembolic or cardiopulmonary complications. All of her questions were answered.  Her preoperative exam was completed and the appropriate consents were signed. She is scheduled to undergo this procedure in the near future.  Specific Peri-operative Considerations:  - Consent: obtained today  - Labs: CBC, CMP preoperatively  - Bowel Preparation: None required - Abx:  Not required - VTE ppx: SCDs perioperatively

## 2017-05-28 NOTE — Discharge Instructions (Signed)
Laparoscopic Ovarian Surgery Discharge Instructions  Please start the 50 microgram estrogen birth control pills in 3 days, and come for a lab to recheck your ovarian hormones just before.  For the next three days, take ibuprofen and acetaminophen on a schedule, every 8 hours. You can take them together or you can intersperse them, and take one every four hours. I also gave you gabapentin for nighttime, to help you sleep and also to control pain. Take gabapentin medicines at night for at least the next 3 nights. You also have a narcotic, oxycodone, to take as needed if the above medicines don't help.  Postop constipation is a major cause of pain. Stay well hydrated, walk as you tolerate, and take over the counter senna as well as stool softeners if you need them.   RISKS AND COMPLICATIONS   Infection.  Bleeding.  Injury to surrounding organs.  Anesthetic side effects.   PROCEDURE   You may be given a medicine to help you relax (sedative) before the procedure. You will be given a medicine to make you sleep (general anesthetic) during the procedure.  A tube will be put down your throat to help your breath while under general anesthesia.  Several small cuts (incisions) are made in the lower abdominal area and one incision is made near the belly button.  Your abdominal area will be inflated with a safe gas (carbon dioxide). This helps give the surgeon room to operate, visualize, and helps the surgeon avoid other organs.  A thin, lighted tube (laparoscope) with a camera attached is inserted into your abdomen through the incision near the belly button. Other small instruments may also be inserted through other abdominal incisions.  The ovary is located and are removed.  After the ovary is removed, the gas is released from the abdomen.  The incisions will be closed with stitches (sutures), and Dermabond. A bandage may be placed over the incisions.  AFTER THE PROCEDURE   You will also  have some mild abdominal discomfort for 3-7 days. You will be given pain medicine to ease any discomfort.  As long as there are no problems, you may be allowed to go home. Someone will need to drive you home and be with you for at least 24 hours once home.  You may have some mild discomfort in the throat. This is from the tube placed in your throat while you were sleeping.  You may experience discomfort in the shoulder area from some trapped air between the liver and diaphragm. This sensation is normal and will slowly go away on its own.  HOME CARE INSTRUCTIONS   Take all medicines as directed.  Only take over-the-counter or prescription medicines for pain, discomfort, or fever as directed by your caregiver.  Resume daily activities as directed.  Showers are preferred over baths for 2 weeks.  You may resume sexual activities in 1 week or as you feel you would like to.  Do not drive while taking narcotics.  SEEK MEDICAL CARE IF: .  There is increasing abdominal pain.  You feel lightheaded or faint.  You have the chills.  You have an oral temperature above 102 F (38.9 C).  There is pus-like (purulent) drainage from any of the wounds.  You are unable to pass gas or have a bowel movement.  You feel sick to your stomach (nauseous) or throw up (vomit) and can't control it with your medicines.  MAKE SURE YOU:   Understand these instructions.  Will watch your  condition.  Will get help right away if you are not doing well or get worse.  [employer] Patient Information 2013 [place] [place].

## 2017-05-28 NOTE — Anesthesia Postprocedure Evaluation (Signed)
Anesthesia Post Note  Patient: Dana RegulusJennifer D Baldwin  Procedure(s) Performed: LAPAROSCOPY OPERATIVE (N/A Abdomen) OVARIAN CYSTECTOMY (Left Abdomen)  Patient location during evaluation: PACU Anesthesia Type: General Level of consciousness: awake and alert Pain management: pain level controlled Vital Signs Assessment: post-procedure vital signs reviewed and stable Respiratory status: spontaneous breathing, nonlabored ventilation, respiratory function stable and patient connected to nasal cannula oxygen Cardiovascular status: blood pressure returned to baseline and stable Postop Assessment: no apparent nausea or vomiting Anesthetic complications: no     Last Vitals:  Vitals:   05/28/17 1526 05/28/17 1540  BP: 120/88 123/62  Pulse: 90 (!) 58  Resp: 13 14  Temp:  (!) 36.3 C  SpO2: 100% 100%    Last Pain:  Vitals:   05/28/17 1540  TempSrc:   PainSc: 6                  Demetric Parslow S

## 2017-05-28 NOTE — Anesthesia Post-op Follow-up Note (Signed)
Anesthesia QCDR form completed.        

## 2017-05-28 NOTE — Transfer of Care (Signed)
Immediate Anesthesia Transfer of Care Note  Patient: Dana Baldwin  Procedure(s) Performed: LAPAROSCOPY OPERATIVE (N/A Abdomen) OVARIAN CYSTECTOMY (Left Abdomen)  Patient Location: PACU  Anesthesia Type:General  Level of Consciousness: awake  Airway & Oxygen Therapy: Patient Spontanous Breathing  Post-op Assessment: Report given to RN  Post vital signs: Reviewed  Last Vitals:  Vitals:   05/28/17 0938 05/28/17 1425  BP: (!) 126/49 120/64  Pulse: 67 65  Resp: 16 14  Temp: (!) 36.2 C (!) 36.2 C  SpO2: 100% 100%    Last Pain:  Vitals:   05/28/17 1425  TempSrc:   PainSc: Asleep         Complications: No apparent anesthesia complications

## 2017-05-28 NOTE — Op Note (Signed)
Dana Baldwin PROCEDURE DATE: 05/28/2017  PREOPERATIVE DIAGNOSIS: Acute pelvic pain, left ovarian cyst POSTOPERATIVE DIAGNOSIS: same PROCEDURE: Diagnostic laparoscopy, left ovarian cystectomy, left partial salpingectomy SURGEON:  Dr. Christeen DouglasBethany Dreshon Proffit ASSISTANT: CST ANESTHESIOLOGIST: Berdine Addisonhomas, Mathai, MD Anesthesiologist: Berdine Addisonhomas, Mathai, MD CRNA: Deri FuellingPrivette, Kristale, CRNA  INDICATIONS: 37 y.o. with history of acute pelvic pain desiring surgical evaluation. Recurrently ovarian cysts.  Please see preoperative notes for further details.  Risks of surgery were discussed with the patient including but not limited to: bleeding which may require transfusion or reoperation; infection which may require antibiotics; injury to bowel, bladder, ureters or other surrounding organs; need for additional procedures including laparotomy; thromboembolic phenomenon, incisional problems and other postoperative/anesthesia complications. Written informed consent was obtained.    FINDINGS:  Small uterus with normal fallopian tubes bilaterally.  Large left simple ovarian cyst. No other abdominal/pelvic abnormality.  Normal upper abdomen.  ANESTHESIA:    General INTRAVENOUS FLUIDS: 700 ml ESTIMATED BLOOD LOSS: minimal URINE OUTPUT: 100 ml SPECIMENS: Left fimbrea with left ovarian cyst and portion of normal left ovary attached to tube.  COMPLICATIONS: None immediate  PROCEDURE IN DETAIL:  The patient had sequential compression devices applied to her lower extremities while in the preoperative area.  She was then taken to the operating room where general anesthesia was administered and was found to be adequate.  She was placed in the dorsal lithotomy position, and was prepped and draped in a sterile manner.  A Foley catheter was inserted into her bladder and attached to constant drainage and a uterine manipulator was then advanced into the uterus .  After an adequate timeout was performed, attention was turned to the abdomen  where an umbilical incision was made with the scalpel.  The Optiview 5-mm trocar and sleeve were then advanced without difficulty with the laparoscope under direct visualization into the abdomen.  The abdomen was then insufflated with carbon dioxide gas and adequate pneumoperitoneum was obtained.   A detailed survey of the patient's pelvis and abdomen revealed the findings as mentioned above.  An additional 5mm trocar was placed in the bilateral lower quadrants under direct visualization.  Evaluation of the pelvic sidewalls to observe the course of the ureters was undertaken, assuring the ureter was outside the operative field.   ovarian cystectomy -  The ovarian cyst was evaluated. Left tube was attached at cyst wall, and both were dissected out together to control bleeding that continued after cystectomy. A small portion of normal ovary was left intact, as we had discussed prior to surgery. Hemostasis was assured with electrocautery. The pelvis was irrigated and all fluid and blood removed. The patient was placed in reverse Trendelenburg and all fluid removed.  The operative site was surveyed, and it was found to be hemostatic.  No intraoperative injury to surrounding organs was noted.   Pictures were taken of the quadrants and pelvis. The abdomen was desufflated and all instruments were then removed from the patient's abdomen. The uterine manipulator was removed without complications.  All incisions were closed with 4-0 Vicryl and Dermabond.   The patient tolerated the procedures well.  All instruments, needles, and sponge counts were correct x 2. The patient was taken to the recovery room in stable condition.

## 2017-05-28 NOTE — Anesthesia Preprocedure Evaluation (Addendum)
Anesthesia Evaluation  Patient identified by MRN, date of birth, ID band Patient awake    Reviewed: Allergy & Precautions, NPO status , Patient's Chart, lab work & pertinent test results  History of Anesthesia Complications Negative for: history of anesthetic complications  Airway Mallampati: I  TM Distance: >3 FB Neck ROM: Full    Dental no notable dental hx.    Pulmonary neg pulmonary ROS, neg sleep apnea, neg COPD,    breath sounds clear to auscultation- rhonchi (-) wheezing      Cardiovascular Exercise Tolerance: Good (-) hypertension(-) CAD and (-) Past MI  Rhythm:Regular Rate:Normal - Systolic murmurs and - Diastolic murmurs    Neuro/Psych negative neurological ROS  negative psych ROS   GI/Hepatic negative GI ROS, Neg liver ROS,   Endo/Other  negative endocrine ROSneg diabetes  Renal/GU negative Renal ROS     Musculoskeletal negative musculoskeletal ROS (+)   Abdominal (+) - obese,   Peds  Hematology negative hematology ROS (+)   Anesthesia Other Findings   Reproductive/Obstetrics                             Anesthesia Physical Anesthesia Plan  ASA: I  Anesthesia Plan: General   Post-op Pain Management:    Induction: Intravenous  PONV Risk Score and Plan: 2 and Dexamethasone, Ondansetron and Midazolam  Airway Management Planned: Oral ETT  Additional Equipment:   Intra-op Plan:   Post-operative Plan: Extubation in OR  Informed Consent: I have reviewed the patients History and Physical, chart, labs and discussed the procedure including the risks, benefits and alternatives for the proposed anesthesia with the patient or authorized representative who has indicated his/her understanding and acceptance.   Dental advisory given  Plan Discussed with: CRNA and Anesthesiologist  Anesthesia Plan Comments:         Anesthesia Quick Evaluation

## 2017-05-28 NOTE — Anesthesia Procedure Notes (Signed)
Procedure Name: Intubation Date/Time: 05/28/2017 1:08 PM Performed by: Timoteo Expose, CRNA Pre-anesthesia Checklist: Patient identified, Emergency Drugs available, Suction available, Timeout performed and Patient being monitored Patient Re-evaluated:Patient Re-evaluated prior to induction Oxygen Delivery Method: Circle system utilized Preoxygenation: Pre-oxygenation with 100% oxygen Induction Type: IV induction Laryngoscope Size: Mac and 3 Grade View: Grade I Tube type: Oral Tube size: 7.0 mm Number of attempts: 1 Airway Equipment and Method: Stylet Placement Confirmation: ETT inserted through vocal cords under direct vision,  positive ETCO2,  CO2 detector and breath sounds checked- equal and bilateral Secured at: 21 cm Tube secured with: Tape Dental Injury: Teeth and Oropharynx as per pre-operative assessment

## 2017-05-29 ENCOUNTER — Encounter: Payer: Self-pay | Admitting: Obstetrics and Gynecology

## 2017-06-01 LAB — SURGICAL PATHOLOGY

## 2017-07-01 ENCOUNTER — Ambulatory Visit: Payer: Managed Care, Other (non HMO) | Admitting: Physical Therapy

## 2017-07-07 ENCOUNTER — Encounter: Payer: Managed Care, Other (non HMO) | Admitting: Physical Therapy

## 2017-07-15 ENCOUNTER — Ambulatory Visit: Payer: Managed Care, Other (non HMO) | Admitting: Physical Therapy

## 2017-07-22 ENCOUNTER — Ambulatory Visit: Payer: Managed Care, Other (non HMO) | Admitting: Physical Therapy

## 2017-07-28 ENCOUNTER — Encounter: Payer: Managed Care, Other (non HMO) | Admitting: Physical Therapy

## 2017-08-05 ENCOUNTER — Encounter: Payer: Managed Care, Other (non HMO) | Admitting: Physical Therapy

## 2017-08-12 ENCOUNTER — Encounter: Payer: Managed Care, Other (non HMO) | Admitting: Physical Therapy

## 2017-08-19 ENCOUNTER — Encounter: Payer: Managed Care, Other (non HMO) | Admitting: Physical Therapy

## 2018-03-07 ENCOUNTER — Other Ambulatory Visit: Payer: Self-pay

## 2018-03-07 ENCOUNTER — Ambulatory Visit
Admission: EM | Admit: 2018-03-07 | Discharge: 2018-03-07 | Disposition: A | Discharge status: Home / Self Care | Payer: Managed Care, Other (non HMO) | Attending: Family Medicine | Admitting: Family Medicine

## 2018-03-07 ENCOUNTER — Encounter: Payer: Self-pay | Admitting: Emergency Medicine

## 2018-03-07 DIAGNOSIS — B9789 Other viral agents as the cause of diseases classified elsewhere: Secondary | ICD-10-CM

## 2018-03-07 DIAGNOSIS — J069 Acute upper respiratory infection, unspecified: Secondary | ICD-10-CM | POA: Diagnosis not present

## 2018-03-07 DIAGNOSIS — J029 Acute pharyngitis, unspecified: Secondary | ICD-10-CM

## 2018-03-07 LAB — RAPID STREP SCREEN (MED CTR MEBANE ONLY): STREPTOCOCCUS, GROUP A SCREEN (DIRECT): NEGATIVE

## 2018-03-07 MED ORDER — LIDOCAINE VISCOUS HCL 2 % MT SOLN: OROMUCOSAL | 0 refills | Status: DC

## 2018-03-07 NOTE — ED Provider Notes (Signed)
[facility]    CSN: [id number] Arrival date & time: 03/07/18  0930     History   Chief Complaint Chief Complaint  Patient presents with  . Sore Throat    APPT    HPI Dana Baldwin is a 37 y.o. female.   The history is provided by the patient.  URI  Presenting symptoms: congestion, cough and sore throat   Presenting symptoms: no ear pain, no facial pain and no fever   Severity:  Moderate Onset quality:  Sudden Duration:  4 days Timing:  Constant Progression:  Unchanged Chronicity:  New Relieved by:  Nothing Ineffective treatments:  OTC medications Associated symptoms: neck pain   Associated symptoms: no sinus pain and no wheezing   Risk factors: sick contacts (co-worker with strep)   Risk factors: not elderly, no chronic cardiac disease, no chronic kidney disease, no chronic respiratory disease, no diabetes mellitus, no immunosuppression, no recent illness and no recent travel     History reviewed. No pertinent past medical history.  There are no active problems to display for this patient.   Past Surgical History:  Procedure Laterality Date  . LAPAROSCOPIC OVARIAN CYSTECTOMY Bilateral 01/11/2017   Procedure: LAPAROSCOPIC OVARIAN CYSTECTOMY;  Surgeon: Christeen DouglasBeasley, Bethany, MD;  Location: [facility];  Service: Gynecology;  Laterality: Bilateral;  . LAPAROSCOPIC OVARIAN CYSTECTOMY Right 04/30/2017   Procedure: LAPAROSCOPIC OVARIAN CYSTECTOMY;  Surgeon: Christeen DouglasBeasley, Bethany, MD;  Location: [facility];  Service: Gynecology;  Laterality: Right;  . LAPAROSCOPY N/A 04/30/2017   Procedure: LAPAROSCOPY DIAGNOSTIC;  Surgeon: Christeen DouglasBeasley, Bethany, MD;  Location: [facility];  Service: Gynecology;  Laterality: N/A;  . LAPAROSCOPY N/A 05/28/2017   Procedure: LAPAROSCOPY OPERATIVE;  Surgeon: Christeen DouglasBeasley, Bethany, MD;  Location: [facility];  Service: Gynecology;  Laterality: N/A;  . OVARIAN CYST REMOVAL Left 05/28/2017   Procedure: OVARIAN CYSTECTOMY;  Surgeon: Christeen DouglasBeasley, Bethany, MD;  Location:  [facility];  Service: Gynecology;  Laterality: Left;  . TENDON REPAIR Right    pinky finger    OB History   None      Home Medications    Prior to Admission medications   Medication Sig Start Date End Date Taking? Authorizing Provider  docusate sodium (COLACE) 100 MG capsule Take 1 capsule (100 mg total) by mouth 2 (two) times daily. To keep stools soft 05/28/17   Christeen DouglasBeasley, Bethany, MD  gabapentin (NEURONTIN) 800 MG tablet Take 1 tablet (800 mg total) by mouth at bedtime for 3 days. 04/30/17 05/25/17  Christeen DouglasBeasley, Bethany, MD  gabapentin (NEURONTIN) 800 MG tablet Take 1 tablet (800 mg total) by mouth at bedtime for 14 days. Take nightly for 3 days, then up to 14 days as needed 05/28/17 06/11/17  Christeen DouglasBeasley, Bethany, MD  ibuprofen (ADVIL,MOTRIN) 800 MG tablet Take 1 tablet (800 mg total) by mouth every 8 (eight) hours as needed for moderate pain. 01/11/17   Christeen DouglasBeasley, Bethany, MD  ibuprofen (ADVIL,MOTRIN) 800 MG tablet Take 1 tablet (800 mg total) by mouth every 8 (eight) hours as needed for moderate pain. 05/28/17   Christeen DouglasBeasley, Bethany, MD  levonorgestrel (MIRENA) 20 MCG/24HR IUD 1 each by Intrauterine route once.    [provider]  lidocaine (XYLOCAINE) 2 % solution 20 ml gargle and spit q 6 hours prn sore throat 03/07/18   Payton Mccallumonty, Kamareon Sciandra, MD  oxycodone (OXY-IR) 5 MG capsule Take 1 capsule (5 mg total) by mouth every 6 (six) hours as needed for pain. 05/28/17   Christeen DouglasBeasley, Bethany, MD  traMADol (ULTRAM) 50 MG tablet Take 50  mg by mouth at bedtime.    [provider]    Family History History reviewed. No pertinent family history.  Social History Social History   Tobacco Use  . Smoking status: Never Smoker  . Smokeless tobacco: Never Used  Substance Use Topics  . Alcohol use: Yes    Comment: occ.  . Drug use: No     Allergies   Guaifenesin and Sudafed [pseudoephedrine]   Review of Systems Review of Systems  Constitutional: Negative for fever.  HENT: Positive for congestion and  sore throat. Negative for ear pain and sinus pain.   Respiratory: Positive for cough. Negative for wheezing.   Musculoskeletal: Positive for neck pain.     Physical Exam Triage Vital Signs ED Triage Vitals  Enc Vitals Group     BP 03/07/18 0944 120/86     Pulse Rate 03/07/18 0944 99     Resp 03/07/18 0944 18     Temp 03/07/18 0944 99.4 F (37.4 C)     Temp Source 03/07/18 0944 Oral     SpO2 03/07/18 0944 99 %     Weight 03/07/18 0941 140 lb (63.5 kg)     Height 03/07/18 0941 5\' 3"  (1.6 m)     Head Circumference --      Peak Flow --      Pain Score 03/07/18 0941 5     Pain Loc --      Pain Edu? --      Excl. in GC? --    No data found.  Updated Vital Signs BP 120/86 (BP Location: Left Arm)   Pulse 99   Temp 99.4 F (37.4 C) (Oral)   Resp 18   Ht 5\' 3"  (1.6 m)   Wt 63.5 kg   SpO2 99%   BMI 24.80 kg/m   Visual Acuity Right Eye Distance:   Left Eye Distance:   Bilateral Distance:    Right Eye Near:   Left Eye Near:    Bilateral Near:     Physical Exam  Constitutional: She appears well-developed and well-nourished. No distress.  HENT:  Head: Normocephalic and atraumatic.  Right Ear: Tympanic membrane, external ear and ear canal normal.  Left Ear: Tympanic membrane, external ear and ear canal normal.  Nose: Nose normal. No nose lacerations, sinus tenderness, nasal deformity, septal deviation or nasal septal hematoma. No epistaxis.  No foreign bodies.  Mouth/Throat: Uvula is midline and mucous membranes are normal. Posterior oropharyngeal erythema present. No oropharyngeal exudate, posterior oropharyngeal edema or tonsillar abscesses. No tonsillar exudate.  Eyes: Conjunctivae are normal.  Neck: Normal range of motion. Neck supple. No JVD present. No tracheal deviation present. No thyromegaly present.  Cardiovascular: Normal rate, regular rhythm, normal heart sounds and intact distal pulses.  No murmur heard. Pulmonary/Chest: Effort normal and breath sounds  normal. No stridor. No respiratory distress. She has no wheezes. She has no rales. She exhibits no tenderness.  Musculoskeletal: She exhibits no edema or tenderness.  Lymphadenopathy:    She has no cervical adenopathy.  Neurological: She is alert. She has normal reflexes.  Skin: She is not diaphoretic.  Nursing note and vitals reviewed.    UC Treatments / Results  Labs (all labs ordered are listed, but only abnormal results are displayed) Labs Reviewed  RAPID STREP SCREEN ([facility] ONLY)  CULTURE, GROUP A STREP [facility])    EKG None  Radiology No results found.  Procedures Procedures (including critical care time)  Medications Ordered in UC Medications -  No data to display  Initial Impression / Assessment and Plan / UC Course  I have reviewed the triage vital signs and the nursing notes.  Pertinent labs & imaging results that were available during my care of the patient were reviewed by me and considered in my medical decision making (see chart for details).      Final Clinical Impressions(s) / UC Diagnoses   Final diagnoses:  Viral pharyngitis  Viral URI with cough    ED Prescriptions    Medication Sig Dispense Auth. Provider   lidocaine (XYLOCAINE) 2 % solution 20 ml gargle and spit q 6 hours prn sore throat 100 mL Payton Mccallum, MD      1. Lab result (rapid strep negative) and diagnosis reviewed with patient 2. rx as per orders above; reviewed possible side effects, interactions, risks and benefits  3. Await strep culture 4. Recommend supportive treatment with rest, fluids, otc medications  5. Follow-up prn if symptoms worsen or don't improve   Controlled Substance Prescriptions [place] Controlled Substance Registry consulted? Not Applicable   Payton Mccallum, MD 03/07/18 1114

## 2018-03-07 NOTE — ED Triage Notes (Signed)
Patient c/o sore throat and neck pain that started 4 days ago. Someone is her office tested positive for strep last week. Denies fever.

## 2018-03-10 LAB — CULTURE, GROUP A STREP (THRC)

## 2019-07-08 ENCOUNTER — Other Ambulatory Visit: Payer: Self-pay

## 2019-07-08 ENCOUNTER — Encounter: Payer: Self-pay | Admitting: Emergency Medicine

## 2019-07-08 ENCOUNTER — Emergency Department: Payer: Managed Care, Other (non HMO)

## 2019-07-08 ENCOUNTER — Emergency Department
Admission: EM | Admit: 2019-07-08 | Discharge: 2019-07-08 | Disposition: A | Discharge status: Home / Self Care | Payer: Managed Care, Other (non HMO) | Attending: Emergency Medicine | Admitting: Emergency Medicine

## 2019-07-08 DIAGNOSIS — M542 Cervicalgia: Secondary | ICD-10-CM | POA: Diagnosis not present

## 2019-07-08 DIAGNOSIS — U071 COVID-19: Secondary | ICD-10-CM | POA: Insufficient documentation

## 2019-07-08 DIAGNOSIS — Z79899 Other long term (current) drug therapy: Secondary | ICD-10-CM | POA: Diagnosis not present

## 2019-07-08 DIAGNOSIS — G44311 Acute post-traumatic headache, intractable: Secondary | ICD-10-CM | POA: Insufficient documentation

## 2019-07-08 DIAGNOSIS — W228XXA Striking against or struck by other objects, initial encounter: Secondary | ICD-10-CM | POA: Diagnosis not present

## 2019-07-08 DIAGNOSIS — Y9239 Other specified sports and athletic area as the place of occurrence of the external cause: Secondary | ICD-10-CM | POA: Insufficient documentation

## 2019-07-08 DIAGNOSIS — R519 Headache, unspecified: Secondary | ICD-10-CM | POA: Diagnosis present

## 2019-07-08 DIAGNOSIS — R42 Dizziness and giddiness: Secondary | ICD-10-CM

## 2019-07-08 LAB — POCT PREGNANCY, URINE: Preg Test, Ur: NEGATIVE

## 2019-07-08 MED ORDER — CYCLOBENZAPRINE HCL 10 MG PO TABS
5.0000 mg | ORAL_TABLET | Freq: Once | ORAL | Status: AC
  Administered 2019-07-08: 5 mg via ORAL
  Filled 2019-07-08: qty 1

## 2019-07-08 MED ORDER — TRAMADOL HCL 50 MG PO TABS
50.0000 mg | ORAL_TABLET | Freq: Once | ORAL | Status: AC
  Administered 2019-07-08: 50 mg via ORAL
  Filled 2019-07-08: qty 1

## 2019-07-08 MED ORDER — ONDANSETRON HCL 4 MG PO TABS: 4.0000 mg | ORAL_TABLET | Freq: Three times a day (TID) | ORAL | 0 refills | Status: DC | PRN

## 2019-07-08 NOTE — ED Notes (Addendum)
Informed patient that the COVID results were accidentially entered instead of the negative urine pregnancy. Advised her that it may take some time for it to disappear from her chart.

## 2019-07-08 NOTE — ED Notes (Signed)
Urine sent to lab with save label 
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
## 2019-07-08 NOTE — ED Notes (Signed)
Patient walked to room with a steady gait. Patient c/o nausea, but denies vision changes. Patient has a discoloration on left temple approximately 2cmx2cm.
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
## 2019-07-10 LAB — POC SARS CORONAVIRUS 2 AG

## 2020-03-15 ENCOUNTER — Encounter: Payer: Self-pay | Admitting: Obstetrics and Gynecology

## 2020-03-15 NOTE — Progress Notes (Signed)
PCP:  Patient, No Pcp Per   Chief Complaint  Patient presents with  . Gynecologic Exam     HPI:      [name] is a 39 y.o. No obstetric history on file. whose LMP was No LMP recorded. (Menstrual status: Oral contraceptives)., presents today for her NP annual examination.  Her menses are absent due to continuous dosing of OCPs. Has occas spotting, no dysmen. Had problems with IUD in past. Pt is s/p lap ovarian cystectomy bilat  Sex activity: single partner, contraception - OCP (estrogen/progesterone).  Last Pap: not recent; hx of abn pap in past with normal repeat; no tx  There is no FH of breast cancer. There is no FH of ovarian cancer. The patient does not do self-breast exams.  Tobacco use: The patient denies current or previous tobacco use. Alcohol use: social drinker No drug use.  Exercise: moderately active  She does get adequate calcium but not Vitamin D in her diet. Would like fasting labs and Covid AB testing. Has not had vaccine.    Upstream - 03/18/20 0825      Pregnancy Intention Screening   Does the patient want to become pregnant in the next year? No    Does the patient's partner want to become pregnant in the next year? No    Would the patient like to discuss contraceptive options today? Yes      Contraception Wrap Up   Current Method Oral Contraceptive          The pregnancy intention screening data noted above was reviewed. Potential methods of contraception were discussed. The patient elected to proceed with Oral Contraceptive.     Past Medical History:  Diagnosis Date  . Ovarian cyst 2019   LEFT    Past Surgical History:  Procedure Laterality Date  . LAPAROSCOPIC OVARIAN CYSTECTOMY Bilateral 01/11/2017   Procedure: LAPAROSCOPIC OVARIAN CYSTECTOMY;  Surgeon: Christeen Douglas, MD;  Location: [facility];  Service: Gynecology;  Laterality: Bilateral;  . LAPAROSCOPIC OVARIAN CYSTECTOMY Right 04/30/2017   Procedure: LAPAROSCOPIC OVARIAN  CYSTECTOMY;  Surgeon: Christeen Douglas, MD;  Location: [facility];  Service: Gynecology;  Laterality: Right;  . LAPAROSCOPY N/A 04/30/2017   Procedure: LAPAROSCOPY DIAGNOSTIC;  Surgeon: Christeen Douglas, MD;  Location: [facility];  Service: Gynecology;  Laterality: N/A;  . LAPAROSCOPY N/A 05/28/2017   Procedure: LAPAROSCOPY OPERATIVE;  Surgeon: Christeen Douglas, MD;  Location: [facility];  Service: Gynecology;  Laterality: N/A;  . OVARIAN CYST REMOVAL Left 05/28/2017   Procedure: OVARIAN CYSTECTOMY;  Surgeon: Christeen Douglas, MD;  Location: [facility];  Service: Gynecology;  Laterality: Left;  . TENDON REPAIR Right    pinky finger    Family History  Problem Relation Age of Onset  . Cancer Maternal Aunt     Social History   Socioeconomic History  . Marital status: Married    Spouse name: Not on file  . Number of children: Not on file  . Years of education: Not on file  . Highest education level: Not on file  Occupational History  . Not on file  Tobacco Use  . Smoking status: Never Smoker  . Smokeless tobacco: Never Used  Vaping Use  . Vaping Use: Never used  Substance and Sexual Activity  . Alcohol use: Yes    Comment: occ.  . Drug use: No  . Sexual activity: Yes    Birth control/protection: Pill  Other Topics Concern  . Not on file  Social History Narrative  . Not  on file   Social Determinants of Health   Financial Resource Strain:   . Difficulty of Paying Living Expenses: Not on file  Food Insecurity:   . Worried About Programme researcher, broadcasting/film/video in the Last Year: Not on file  . Ran Out of Food in the Last Year: Not on file  Transportation Needs:   . Lack of Transportation (Medical): Not on file  . Lack of Transportation (Non-Medical): Not on file  Physical Activity:   . Days of Exercise per Week: Not on file  . Minutes of Exercise per Session: Not on file  Stress:   . Feeling of Stress : Not on file  Social Connections:   . Frequency of Communication with Friends and Family:  Not on file  . Frequency of Social Gatherings with Friends and Family: Not on file  . Attends Religious Services: Not on file  . Active Member of Clubs or Organizations: Not on file  . Attends Banker Meetings: Not on file  . Marital Status: Not on file  Intimate Partner Violence:   . Fear of Current or Ex-Partner: Not on file  . Emotionally Abused: Not on file  . Physically Abused: Not on file  . Sexually Abused: Not on file     Current Outpatient Medications:  .  CRYSELLE-28 0.3-30 MG-MCG tablet, Take 1 tablet by mouth daily. CONTINUOUS DOSING, Disp: 84 tablet, Rfl: 4     ROS:  Review of Systems  Constitutional: Negative for fatigue, fever and unexpected weight change.  Respiratory: Negative for cough, shortness of breath and wheezing.   Cardiovascular: Negative for chest pain, palpitations and leg swelling.  Gastrointestinal: Negative for blood in stool, constipation, diarrhea, nausea and vomiting.  Endocrine: Negative for cold intolerance, heat intolerance and polyuria.  Genitourinary: Negative for dyspareunia, dysuria, flank pain, frequency, genital sores, hematuria, menstrual problem, pelvic pain, urgency, vaginal bleeding, vaginal discharge and vaginal pain.  Musculoskeletal: Negative for back pain, joint swelling and myalgias.  Skin: Negative for rash.  Neurological: Negative for dizziness, syncope, light-headedness, numbness and headaches.  Hematological: Negative for adenopathy.  Psychiatric/Behavioral: Negative for agitation, confusion, sleep disturbance and suicidal ideas. The patient is not nervous/anxious.    BREAST: No symptoms   Objective: BP 120/70   Ht 5\' 3"  (1.6 m)   Wt 146 lb (66.2 kg)   BMI 25.86 kg/m    Physical Exam Constitutional:      Appearance: She is well-developed.  Genitourinary:     Vulva, vagina, cervix, uterus, right adnexa and left adnexa normal.     No vulval lesion or tenderness noted.     No vaginal discharge,  erythema or tenderness.     No cervical polyp.     Uterus is not enlarged or tender.     No right or left adnexal mass present.     Right adnexa not tender.     Left adnexa not tender.  Neck:     Thyroid: No thyromegaly.  Cardiovascular:     Rate and Rhythm: Normal rate and regular rhythm.     Heart sounds: Normal heart sounds. No murmur heard.   Pulmonary:     Effort: Pulmonary effort is normal.     Breath sounds: Normal breath sounds.  Chest:     Breasts:        Right: No mass, nipple discharge, skin change or tenderness.        Left: No mass, nipple discharge, skin change or tenderness.  Abdominal:  Palpations: Abdomen is soft.     Tenderness: There is no abdominal tenderness. There is no guarding.  Musculoskeletal:        General: Normal range of motion.     Cervical back: Normal range of motion.  Neurological:     General: No focal deficit present.     Mental Status: She is alert and oriented to person, place, and time.     Cranial Nerves: No cranial nerve deficit.  Skin:    General: Skin is warm and dry.  Psychiatric:        Mood and Affect: Mood normal.        Behavior: Behavior normal.        Thought Content: Thought content normal.        Judgment: Judgment normal.  Vitals reviewed.     Assessment/Plan: Encounter for annual routine gynecological examination  Cervical cancer screening - Plan: Cytology - PAP  Screening for HPV (human papillomavirus) - Plan: Cytology - PAP  Encounter for surveillance of contraceptive pills - Plan: CRYSELLE-28 0.3-30 MG-MCG tablet; OCP RF  Blood tests for routine general physical examination - Plan: Comprehensive metabolic panel, Lipid panel  Screening cholesterol level - Plan: Lipid panel  Encounter for screening laboratory testing for COVID-19 virus in asymptomatic patient - Plan: SARS-CoV-2 Semi-Quantitative Total Antibody, Spike  Meds ordered this encounter  Medications  . CRYSELLE-28 0.3-30 MG-MCG tablet    Sig:  Take 1 tablet by mouth daily. CONTINUOUS DOSING    Dispense:  84 tablet    Refill:  4    Order Specific Question:   Supervising Provider    Answer:   Nadara Mustard [[id number]]             GYN counsel adequate intake of calcium and vitamin D, diet and exercise     F/U  Return in about 1 year (around 03/18/2021).  Tema Alire B. Sylvanus Telford, PA-C 03/18/2020 8:52 AM

## 2020-03-18 ENCOUNTER — Other Ambulatory Visit (HOSPITAL_COMMUNITY)
Admission: RE | Admit: 2020-03-18 | Discharge: 2020-03-18 | Disposition: A | Discharge status: Home / Self Care | Payer: Managed Care, Other (non HMO) | Source: Ambulatory Visit | Attending: Obstetrics and Gynecology | Admitting: Obstetrics and Gynecology

## 2020-03-18 ENCOUNTER — Other Ambulatory Visit: Payer: Self-pay

## 2020-03-18 ENCOUNTER — Encounter: Payer: Self-pay | Admitting: Obstetrics and Gynecology

## 2020-03-18 ENCOUNTER — Ambulatory Visit (INDEPENDENT_AMBULATORY_CARE_PROVIDER_SITE_OTHER): Payer: Managed Care, Other (non HMO) | Admitting: Obstetrics and Gynecology

## 2020-03-18 VITALS — BP 120/70 | Ht 63.0 in | Wt 146.0 lb

## 2020-03-18 DIAGNOSIS — Z1151 Encounter for screening for human papillomavirus (HPV): Secondary | ICD-10-CM | POA: Insufficient documentation

## 2020-03-18 DIAGNOSIS — Z01419 Encounter for gynecological examination (general) (routine) without abnormal findings: Secondary | ICD-10-CM | POA: Diagnosis not present

## 2020-03-18 DIAGNOSIS — Z13220 Encounter for screening for lipoid disorders: Secondary | ICD-10-CM

## 2020-03-18 DIAGNOSIS — Z124 Encounter for screening for malignant neoplasm of cervix: Secondary | ICD-10-CM | POA: Insufficient documentation

## 2020-03-18 DIAGNOSIS — Z3041 Encounter for surveillance of contraceptive pills: Secondary | ICD-10-CM

## 2020-03-18 DIAGNOSIS — Z0000 Encounter for general adult medical examination without abnormal findings: Secondary | ICD-10-CM

## 2020-03-18 DIAGNOSIS — Z20822 Contact with and (suspected) exposure to covid-19: Secondary | ICD-10-CM

## 2020-03-18 MED ORDER — CRYSELLE-28 0.3-30 MG-MCG PO TABS: 1.0000 | ORAL_TABLET | Freq: Every day | ORAL | 4 refills | Status: DC

## 2020-03-18 NOTE — Patient Instructions (Signed)
I value your feedback and entrusting us with your care. If you get a [facility] patient survey, I would appreciate you taking the time to let us know about your experience today. Thank you!  As of March 30, 2019, your lab results will be released to your MyChart immediately, before I even have a chance to see them. Please give me time to review them and contact you if there are any abnormalities. Thank you for your patience.  

## 2020-03-19 LAB — LIPID PANEL
Chol/HDL Ratio: 4.1 ratio (ref 0.0–4.4)
Cholesterol, Total: 162 mg/dL (ref 100–199)
HDL: 40 mg/dL (ref >39)
LDL Chol Calc (NIH): 103 mg/dL — ABNORMAL HIGH (ref 0–99)
Triglycerides: 102 mg/dL (ref 0–149)
VLDL Cholesterol Cal: 19 mg/dL (ref 5–40)

## 2020-03-19 LAB — COMPREHENSIVE METABOLIC PANEL
ALT: 25 IU/L (ref 0–32)
AST: 19 IU/L (ref 0–40)
Albumin/Globulin Ratio: 2 (ref 1.2–2.2)
Albumin: 4.4 g/dL (ref 3.8–4.8)
Alkaline Phosphatase: 46 IU/L (ref 44–121)
BUN/Creatinine Ratio: 18 (ref 9–23)
BUN: 11 mg/dL (ref 6–20)
Bilirubin Total: 0.5 mg/dL (ref 0.0–1.2)
CO2: 22 mmol/L (ref 20–29)
Calcium: 9.1 mg/dL (ref 8.7–10.2)
Chloride: 104 mmol/L (ref 96–106)
Creatinine, Ser: 0.62 mg/dL (ref 0.57–1.00)
GFR calc Af Amer: 133 mL/min/{1.73_m2} (ref >59)
GFR calc non Af Amer: 116 mL/min/{1.73_m2} (ref >59)
Globulin, Total: 2.2 g/dL (ref 1.5–4.5)
Glucose: 89 mg/dL (ref 65–99)
Potassium: 4.1 mmol/L (ref 3.5–5.2)
Sodium: 140 mmol/L (ref 134–144)
Total Protein: 6.6 g/dL (ref 6.0–8.5)

## 2020-03-19 LAB — SARS-COV-2 SEMI-QUANTITATIVE TOTAL ANTIBODY, SPIKE
SARS-CoV-2 Semi-Quant Total Ab: 151.1 U/mL (ref <0.8)
SARS-CoV-2 Spike Ab Interp: POSITIVE

## 2020-03-21 LAB — CYTOLOGY - PAP
Comment: NEGATIVE
Diagnosis: UNDETERMINED — AB
High risk HPV: NEGATIVE

## 2020-04-08 ENCOUNTER — Other Ambulatory Visit: Payer: Self-pay | Admitting: Obstetrics and Gynecology

## 2020-04-08 ENCOUNTER — Encounter: Payer: Self-pay | Admitting: Obstetrics and Gynecology

## 2020-04-08 DIAGNOSIS — Z3041 Encounter for surveillance of contraceptive pills: Secondary | ICD-10-CM

## 2020-04-08 MED ORDER — LOW-OGESTREL 0.3-30 MG-MCG PO TABS: 1.0000 | ORAL_TABLET | Freq: Every day | ORAL | 4 refills | Status: DC

## 2020-04-08 NOTE — Telephone Encounter (Signed)
Pls call pharm to see if all generics of cryselle unavailable so we have to change to different pill with different progestin, or if there is another generic equivalent of cryselle they can change to. Thx.

## 2020-04-08 NOTE — Telephone Encounter (Signed)
Please advise 

## 2020-04-08 NOTE — Progress Notes (Signed)
Rx change to Low ogestrel due to cryselle back order/recall.

## 2020-04-08 NOTE — Telephone Encounter (Signed)
Pharmacist say they have one generic: low-ogestrel. Will need Rx sent in.

## 2020-04-09 NOTE — Telephone Encounter (Signed)
Rx changed and eRxd for a yr

## 2020-05-21 DIAGNOSIS — R102 Pelvic and perineal pain: Secondary | ICD-10-CM | POA: Insufficient documentation

## 2020-05-21 DIAGNOSIS — J309 Allergic rhinitis, unspecified: Secondary | ICD-10-CM | POA: Insufficient documentation

## 2020-05-21 DIAGNOSIS — S66809A Unspecified injury of other specified muscles, fascia and tendons at wrist and hand level, unspecified hand, initial encounter: Secondary | ICD-10-CM | POA: Insufficient documentation

## 2020-05-21 DIAGNOSIS — R1032 Left lower quadrant pain: Secondary | ICD-10-CM | POA: Insufficient documentation

## 2020-05-21 DIAGNOSIS — S61219A Laceration without foreign body of unspecified finger without damage to nail, initial encounter: Secondary | ICD-10-CM | POA: Insufficient documentation

## 2020-05-21 DIAGNOSIS — N83201 Unspecified ovarian cyst, right side: Secondary | ICD-10-CM | POA: Insufficient documentation

## 2020-05-21 DIAGNOSIS — Z8742 Personal history of other diseases of the female genital tract: Secondary | ICD-10-CM | POA: Insufficient documentation

## 2020-05-22 ENCOUNTER — Encounter: Payer: Self-pay | Admitting: Podiatry

## 2020-05-22 ENCOUNTER — Ambulatory Visit: Payer: Managed Care, Other (non HMO) | Admitting: Podiatry

## 2020-05-22 ENCOUNTER — Other Ambulatory Visit: Payer: Self-pay

## 2020-05-22 ENCOUNTER — Ambulatory Visit (INDEPENDENT_AMBULATORY_CARE_PROVIDER_SITE_OTHER): Payer: Managed Care, Other (non HMO)

## 2020-05-22 DIAGNOSIS — S93401A Sprain of unspecified ligament of right ankle, initial encounter: Secondary | ICD-10-CM

## 2020-05-22 DIAGNOSIS — M25571 Pain in right ankle and joints of right foot: Secondary | ICD-10-CM | POA: Diagnosis not present

## 2020-05-22 NOTE — Progress Notes (Signed)
  Subjective:  Patient ID: Dana Baldwin, female    DOB: [birth year],  MRN: [id number]  Chief Complaint  Patient presents with  . Ankle Pain    Patient presents today for right ankle pain from fall back in December.  She said she felt a pop in her ankle and  its still hurts with pressure or walking    40 y.o. female presents with the above complaint. History confirmed with patient.   Objective:  Physical Exam: warm, good capillary refill, no trophic changes or ulcerative lesions, normal DP and PT pulses and normal sensory exam.   Right Foot: Mild to moderate edema of the anterior and lateral ankle with pain on palpation to the ATFL and CFL.  She has pain on anterior drawer exam with minor laxity.  She has pain along the anterior joint line this is worse with compression and range of motion   Radiographs: X-ray of the right foot: No evidence of fracture, there is some subtle lucency in the central lateral talar dome, unclear if this is artifact or evidence of a talar osteochondral defect Assessment:   1. Sprain of right ankle, unspecified ligament, initial encounter   2. Acute right ankle pain      Plan:  Patient was evaluated and treated and all questions answered.   Review the imaging findings and clinical exam with the patient.  Discussed with her that she at the very least has a very severe sprain and likely an attenuation or tear of the ATFL and possibly the CFL.  Her pain along the anterior joint line is concerning for me for a an osteochondral injury of the talus.  I am ordering an MRI to evaluate for this.  I reviewed RICE protocol with her and recommend we immobilize her with a compression ankle gauntlet and short CAM boot.  Follow-up after MRI in 4 weeks.  If improving then we will proceed with physical therapy and nonoperative treatment

## 2020-05-29 ENCOUNTER — Other Ambulatory Visit: Payer: Self-pay

## 2020-05-29 ENCOUNTER — Ambulatory Visit
Admission: RE | Admit: 2020-05-29 | Discharge: 2020-05-29 | Disposition: A | Discharge status: Home / Self Care | Payer: Managed Care, Other (non HMO) | Source: Ambulatory Visit | Attending: Podiatry | Admitting: Podiatry

## 2020-05-29 DIAGNOSIS — M25571 Pain in right ankle and joints of right foot: Secondary | ICD-10-CM

## 2020-05-29 DIAGNOSIS — S93401A Sprain of unspecified ligament of right ankle, initial encounter: Secondary | ICD-10-CM

## 2020-06-10 ENCOUNTER — Other Ambulatory Visit: Payer: Self-pay

## 2020-06-10 ENCOUNTER — Encounter: Payer: Self-pay | Admitting: Podiatry

## 2020-06-10 ENCOUNTER — Ambulatory Visit: Payer: Managed Care, Other (non HMO) | Admitting: Podiatry

## 2020-06-10 DIAGNOSIS — S93491D Sprain of other ligament of right ankle, subsequent encounter: Secondary | ICD-10-CM | POA: Diagnosis not present

## 2020-06-10 NOTE — Progress Notes (Signed)
Subjective:  Patient ID: Dana Baldwin, female    DOB: 01/04/1981,  MRN: [id number]  Chief Complaint  Patient presents with  . Ankle Pain    "its doing ok, a little better.  The worst pain is in the morning when I first get up and when I wear the boot sometimes"    40 y.o. female returns with the above complaint. History confirmed with patient. Has some swelling. Completed MRI. Here for review.  Objective:  Physical Exam: warm, good capillary refill, no trophic changes or ulcerative lesions, normal DP and PT pulses and normal sensory exam.   Right Foot: Mild edema of the anterior and lateral ankle with pain on palpation to the ATFL only today. She has minimal pain on anterior drawer exam with minor laxity.  No gross instability. No anterior joint line pain today   Radiographs: X-ray of the right foot: No evidence of fracture, there is some subtle lucency in the central lateral talar dome, unclear if this is artifact or evidence of a talar osteochondral defect  Study Result  Narrative & Impression  CLINICAL DATA:  Ankle pain and swelling  EXAM: MRI OF THE RIGHT ANKLE WITHOUT CONTRAST  TECHNIQUE: Multiplanar, multisequence MR imaging of the ankle was performed. No intravenous contrast was administered.  COMPARISON:  None.  FINDINGS: TENDONS  Peroneal: Peroneal longus tendon intact. Peroneal brevis intact. A small amount of fluid is seen surrounding the peroneal tendons.  Posteromedial: There is increased intrasubstance signal with a small amount of fluid seen surrounding the posterior tibialis tendon. However there is a normal distal insertion site. Flexor hallucis longus tendon intact. Flexor digitorum longus tendon intact.  Anterior: Tibialis anterior tendon intact. Extensor hallucis longus tendon intact Extensor digitorum longus tendon intact.  Achilles:  Intact.  Plantar Fascia: Mildly increased signal seen within the middle cord of the plantar  fascia.  LIGAMENTS  Lateral: Increased signal and thickening seen within the anterior talofibular ligament, however there are intact fibers seen throughout. There is a small amount of fluid seen within the syndesmosis. Calcaneofibular ligament intact. Posterior talofibular ligament intact. Anterior and posterior tibiofibular ligaments intact.  Medial: Deltoid ligament intact. Spring ligament intact.  CARTILAGE  Ankle Joint: A small ankle joint effusion is seen. Normal ankle mortise. No chondral defect.  Subtalar Joints/Sinus Tarsi: Normal subtalar joints. No subtalar joint effusion. Edema seen within the sinus tarsi.  Bones: No marrow signal abnormality. No fracture, marrow edema, or pathologic marrow infiltration.  Soft Tissue: There is mild edema seen around the anterolateral aspect of the ankle.  IMPRESSION: Intrasubstance sprain of the anterior talofibular ligament with surrounding soft tissue edema.  Mild posterior tibialis and peroneal tenosynovitis  Mild middle cord plantar fasciitis  Small ankle joint effusion and edema within the sinus tarsi.   Electronically Signed   By: Jonna Clark M.D.   On: 05/30/2020 01:10    Assessment:   1. Sprain of anterior talofibular ligament of right ankle, subsequent encounter      Plan:  Patient was evaluated and treated and all questions answered.   -Reviewed MRI findings today -I think we can wean the CAM boot with a transition with a lace up ankle brace. Dispensed TriLock today -Begin physical therapy for ankle stabilization and rehab at [facility] in [place] -We also discussed the benefit of surgical stabilization only if she is not improving. Would plan for at least 2 months of PT prior to visiting this option -Plan to transition to shoe gear at next visit  and hopefully resume full activity then

## 2020-06-19 ENCOUNTER — Ambulatory Visit: Payer: Managed Care, Other (non HMO) | Admitting: Podiatry

## 2020-07-11 ENCOUNTER — Other Ambulatory Visit: Payer: Self-pay | Admitting: Obstetrics and Gynecology

## 2020-07-11 MED ORDER — LOW-OGESTREL 0.3-30 MG-MCG PO TABS: 1.0000 | ORAL_TABLET | Freq: Every day | ORAL | 2 refills | Status: DC

## 2020-07-11 NOTE — Progress Notes (Signed)
[facility], was at [facility]

## 2020-07-22 ENCOUNTER — Ambulatory Visit: Payer: Managed Care, Other (non HMO) | Admitting: Podiatry

## 2021-01-06 ENCOUNTER — Ambulatory Visit
Admission: EM | Admit: 2021-01-06 | Discharge: 2021-01-06 | Disposition: A | Discharge status: Home / Self Care | Payer: Managed Care, Other (non HMO) | Attending: Family Medicine | Admitting: Family Medicine

## 2021-01-06 ENCOUNTER — Encounter: Payer: Self-pay | Admitting: Emergency Medicine

## 2021-01-06 ENCOUNTER — Other Ambulatory Visit: Payer: Self-pay

## 2021-01-06 DIAGNOSIS — Z20822 Contact with and (suspected) exposure to covid-19: Secondary | ICD-10-CM | POA: Insufficient documentation

## 2021-01-06 DIAGNOSIS — J028 Acute pharyngitis due to other specified organisms: Secondary | ICD-10-CM | POA: Diagnosis not present

## 2021-01-06 DIAGNOSIS — J069 Acute upper respiratory infection, unspecified: Secondary | ICD-10-CM | POA: Insufficient documentation

## 2021-01-06 DIAGNOSIS — J029 Acute pharyngitis, unspecified: Secondary | ICD-10-CM | POA: Diagnosis present

## 2021-01-06 LAB — GROUP A STREP BY PCR: Group A Strep by PCR: NOT DETECTED

## 2021-01-06 MED ORDER — IPRATROPIUM BROMIDE 0.06 % NA SOLN: 2.0000 | Freq: Four times a day (QID) | NASAL | 12 refills | Status: DC

## 2021-01-06 NOTE — ED Provider Notes (Signed)
[facility]    CSN: [id number] Arrival date & time: 01/06/21  1254      History   Chief Complaint Chief Complaint  Patient presents with   Sore Throat    HPI Dana Baldwin is a 40 y.o. female.   HPI  40 year old female here for evaluation of sore throat.  Patient reports that she has been experiencing runny nose nasal congestion for the past 2 weeks and then 2 days ago she developed a sharp sore throat, elevated temp to 99, and ear pain that resolved after the first day.  She developed diarrhea yesterday.  She denies cough, shortness breath or wheezing, nausea, or vomiting.  She has had body aches.  Past Medical History:  Diagnosis Date   Ovarian cyst 2019   LEFT    Patient Active Problem List   Diagnosis Date Noted   Allergic rhinitis 05/21/2020   Bilateral ovarian cysts 05/21/2020   History of ovarian cyst 05/21/2020   Injury of flexor tendon of hand 05/21/2020   Laceration of finger 05/21/2020   LLQ pain 05/21/2020   Pelvic pain in female 05/21/2020    Past Surgical History:  Procedure Laterality Date   LAPAROSCOPIC OVARIAN CYSTECTOMY Bilateral 01/11/2017   Procedure: LAPAROSCOPIC OVARIAN CYSTECTOMY;  Surgeon: Christeen Douglas, MD;  Location: [facility];  Service: Gynecology;  Laterality: Bilateral;   LAPAROSCOPIC OVARIAN CYSTECTOMY Right 04/30/2017   Procedure: LAPAROSCOPIC OVARIAN CYSTECTOMY;  Surgeon: Christeen Douglas, MD;  Location: [facility];  Service: Gynecology;  Laterality: Right;   LAPAROSCOPY N/A 04/30/2017   Procedure: LAPAROSCOPY DIAGNOSTIC;  Surgeon: Christeen Douglas, MD;  Location: [facility];  Service: Gynecology;  Laterality: N/A;   LAPAROSCOPY N/A 05/28/2017   Procedure: LAPAROSCOPY OPERATIVE;  Surgeon: Christeen Douglas, MD;  Location: [facility];  Service: Gynecology;  Laterality: N/A;   OVARIAN CYST REMOVAL Left 05/28/2017   Procedure: OVARIAN CYSTECTOMY;  Surgeon: Christeen Douglas, MD;  Location: [facility];  Service: Gynecology;  Laterality:  Left;   TENDON REPAIR Right    pinky finger    OB History     Gravida  2   Para  2   Term  2   Preterm      AB      Living  2      SAB      IAB      Ectopic      Multiple      Live Births  2            Home Medications    Prior to Admission medications   Medication Sig Start Date End Date Taking? Authorizing Provider  ipratropium (ATROVENT) 0.06 % nasal spray Place 2 sprays into both nostrils 4 (four) times daily. 01/06/21  Yes Becky Augusta, NP  norgestrel-ethinyl estradiol (LOW-OGESTREL) 0.3-30 MG-MCG tablet Take 1 tablet by mouth daily. CONTINUOUS DOSING 07/11/20  Yes Copland, Ilona Sorrel, PA-C    Family History Family History  Problem Relation Age of Onset   Cancer Maternal Aunt     Social History Social History   Tobacco Use   Smoking status: Never   Smokeless tobacco: Never  Vaping Use   Vaping Use: Never used  Substance Use Topics   Alcohol use: Yes    Comment: occ.   Drug use: No     Allergies   Guaifenesin and Sudafed [pseudoephedrine]   Review of Systems Review of Systems  Constitutional:  Positive for fatigue and fever. Negative for activity change and appetite change.  HENT:  Positive for congestion, ear pain, rhinorrhea and sore throat.   Respiratory:  Negative for cough, shortness of breath and wheezing.   Gastrointestinal:  Positive for diarrhea. Negative for nausea and vomiting.  Musculoskeletal:  Positive for arthralgias and myalgias.  Skin:  Negative for rash.  Hematological: Negative.   Psychiatric/Behavioral: Negative.      Physical Exam Triage Vital Signs ED Triage Vitals  Enc Vitals Group     BP 01/06/21 1402 125/76     Pulse Rate 01/06/21 1402 72     Resp 01/06/21 1402 18     Temp 01/06/21 1402 99.7 F (37.6 C)     Temp Source 01/06/21 1402 Oral     SpO2 01/06/21 1402 97 %     Weight 01/06/21 1400 145 lb 15.1 oz (66.2 kg)     Height 01/06/21 1400 5\' 3"  (1.6 m)     Head Circumference --      Peak Flow --       Pain Score 01/06/21 1400 4     Pain Loc --      Pain Edu? --      Excl. in GC? --    No data found.  Updated Vital Signs BP 125/76 (BP Location: Left Arm)   Pulse 72   Temp 99.7 F (37.6 C) (Oral)   Resp 18   Ht 5\' 3"  (1.6 m)   Wt 145 lb 15.1 oz (66.2 kg)   SpO2 97%   BMI 25.85 kg/m   Visual Acuity Right Eye Distance:   Left Eye Distance:   Bilateral Distance:    Right Eye Near:   Left Eye Near:    Bilateral Near:     Physical Exam Vitals and nursing note reviewed.  Constitutional:      General: She is not in acute distress.    Appearance: Normal appearance. She is not ill-appearing.  HENT:     Head: Normocephalic and atraumatic.     Right Ear: Tympanic membrane, ear canal and external ear normal. There is no impacted cerumen.     Left Ear: Tympanic membrane, ear canal and external ear normal. There is no impacted cerumen.     Nose: Congestion and rhinorrhea present.     Mouth/Throat:     Mouth: Mucous membranes are moist.     Pharynx: Oropharynx is clear. No oropharyngeal exudate or posterior oropharyngeal erythema.  Cardiovascular:     Rate and Rhythm: Normal rate and regular rhythm.     Pulses: Normal pulses.     Heart sounds: Normal heart sounds. No murmur heard.   No gallop.  Pulmonary:     Effort: Pulmonary effort is normal.     Breath sounds: Normal breath sounds. No wheezing, rhonchi or rales.  Musculoskeletal:     Cervical back: Normal range of motion and neck supple.  Lymphadenopathy:     Cervical: No cervical adenopathy.  Skin:    General: Skin is warm and dry.     Capillary Refill: Capillary refill takes less than 2 seconds.     Findings: No erythema or rash.  Neurological:     General: No focal deficit present.     Mental Status: She is alert and oriented to person, place, and time.  Psychiatric:        Mood and Affect: Mood normal.        Behavior: Behavior normal.        Thought Content: Thought content normal.        Judgment:  Judgment normal.     UC Treatments / Results  Labs (all labs ordered are listed, but only abnormal results are displayed) Labs Reviewed  GROUP A STREP BY PCR  SARS CORONAVIRUS 2 (TAT 6-24 HRS)    EKG   Radiology No results found.  Procedures Procedures (including critical care time)  Medications Ordered in UC Medications - No data to display  Initial Impression / Assessment and Plan / UC Course  I have reviewed the triage vital signs and the nursing notes.  Pertinent labs & imaging results that were available during my care of the patient were reviewed by me and considered in my medical decision making (see chart for details).  Patient is a nontoxic-appearing 40 year old female here for evaluation of sore throat that developed 2 days ago.  During the history taking process she revealed that she has had runny nose nasal congestion for 2 weeks, developed a sore throat 2 days ago along with a fever and ear pain.  The ear pain resolved after 1 day.  She is also had fatigue and slept 14 hours yesterday.  She reports an elevated temp to 99.  She also developed diarrhea yesterday.  No cough, shortness breath or wheezing, nausea, or vomiting.  Patient's physical exam reveals pearly gray tympanic membranes bilaterally with normal light reflex and clear external auditory canals.  Nasal mucosa is erythematous and edematous with clear nasal discharge.  Oropharyngeal exam reveals pink and moist oral mucosal tissue.  No tonsillar edema, erythema, or exudate noted.  No cobblestoning or posterior oropharyngeal injection noted.  Patient does have significant bilateral anterior cervical lymphadenopathy.  Cardiopulmonary exam reveals clear lung sounds all fields.  Strep PCR was collected at triage.  Will order COVID as well as patient symptoms more closely mimic COVID than strep.  There is also an absence of tonsil or erythema, edema, or exudate.  Strep PCR is negative.  Will discharge patient home to  isolate pending the results of her COVID test and treat her for viral URI.  We will give Atrovent nasal spray to help her with her nasal congestion and runny nose.  Patient not having cough at this time so no symptomatic treatment is necessary.  Patient to use over-the-counter preparations should the symptoms develop.   Final Clinical Impressions(s) / UC Diagnoses   Final diagnoses:  Upper respiratory tract infection, unspecified type     Discharge Instructions      Isolate at home pending the results of your COVID test.  If you test positive then you will have to quarantine for 5 days from the start of your symptoms.  After 5 days you can break quarantine if your symptoms have improved and you have not had a fever for 24 hours without taking Tylenol or ibuprofen.  Use over-the-counter Tylenol and ibuprofen as needed for body aches and fever.  Use the Atrovent nasal spray, 2 squirts up each nostril every 6 hours, as needed for runny nose nasal congestion.  If you develop any increased shortness of breath-especially at rest, you are unable to speak in full sentences, or is a late sign your lips are turning blue you need to go the ER for evaluation.      ED Prescriptions     Medication Sig Dispense Auth. Provider   ipratropium (ATROVENT) 0.06 % nasal spray Place 2 sprays into both nostrils 4 (four) times daily. 15 mL Becky Augusta, NP      PDMP not reviewed this encounter.   Becky Augusta,  NP 01/06/21 1510

## 2021-01-06 NOTE — ED Triage Notes (Addendum)
Pt c/o sore throat, and swollen cervical lymph nodes. Started 2 days ago.

## 2021-01-06 NOTE — Discharge Instructions (Addendum)
Isolate at home pending the results of your COVID test.  If you test positive then you will have to quarantine for 5 days from the start of your symptoms.  After 5 days you can break quarantine if your symptoms have improved and you have not had a fever for 24 hours without taking Tylenol or ibuprofen.  Use over-the-counter Tylenol and ibuprofen as needed for body aches and fever.  Use the Atrovent nasal spray, 2 squirts up each nostril every 6 hours, as needed for runny nose nasal congestion.  If you develop any increased shortness of breath-especially at rest, you are unable to speak in full sentences, or is a late sign your lips are turning blue you need to go the ER for evaluation.

## 2021-01-07 LAB — SARS CORONAVIRUS 2 (TAT 6-24 HRS): SARS Coronavirus 2: NEGATIVE

## 2021-02-26 ENCOUNTER — Telehealth: Payer: Self-pay | Admitting: Obstetrics and Gynecology

## 2021-02-26 MED ORDER — LOW-OGESTREL 0.3-30 MG-MCG PO TABS: 1.0000 | ORAL_TABLET | Freq: Every day | ORAL | 0 refills | Status: DC

## 2021-02-26 NOTE — Addendum Note (Signed)
Addended by: Loran Senters D on: 02/26/2021 01:26 PM   Modules accepted: Orders

## 2021-02-26 NOTE — Telephone Encounter (Signed)
Patient is scheduled for 04/08/21 with [facility] for annual

## 2021-03-21 ENCOUNTER — Encounter: Payer: Self-pay | Admitting: Obstetrics and Gynecology

## 2021-04-07 DIAGNOSIS — R87610 Atypical squamous cells of undetermined significance on cytologic smear of cervix (ASC-US): Secondary | ICD-10-CM | POA: Insufficient documentation

## 2021-04-07 NOTE — Progress Notes (Signed)
PCP:  Patient, No Pcp Per (Inactive)   Chief Complaint  Patient presents with   Gynecologic Exam    Pain during intercourse x 1-2 months     HPI:      [name] is a 40 y.o. No obstetric history on file. whose LMP was No LMP recorded. (Menstrual status: Oral contraceptives)., presents today for her annual examination.  Her menses are absent due to continuous dosing of OCPs. Has occas spotting, no dysmen. Had ovar cysts with IUD in past. Pt is s/p lap ovarian cystectomy bilat, no cysts on OCPs.  Sex activity: single partner, contraception - OCP (estrogen/progesterone).  Has had midline sharp suprapubic pains with sex the past 1-2 months, no bleeding. Sx not every time. Had same sx with ovar cysts in past. No urin, GI , vag sx.   Last Pap: 03/18/20 Results were ASCUS/neg HPV DNA; repeat pap today; hx of abn pap in past with normal repeat; no tx  There is no FH of breast cancer. There is no FH of ovarian cancer. The patient does not do self-breast exams.  Tobacco use: The patient denies current or previous tobacco use. Alcohol use: social drinker No drug use.  Exercise: moderately active  She does get adequate calcium but not Vitamin D in her diet. Normal fasting labs 11/21   Past Medical History:  Diagnosis Date   Ovarian cyst 2019   LEFT    Past Surgical History:  Procedure Laterality Date   LAPAROSCOPIC OVARIAN CYSTECTOMY Bilateral 01/11/2017   Procedure: LAPAROSCOPIC OVARIAN CYSTECTOMY;  Surgeon: Benjaman Kindler, MD;  Location: [facility];  Service: Gynecology;  Laterality: Bilateral;   LAPAROSCOPIC OVARIAN CYSTECTOMY Right 04/30/2017   Procedure: LAPAROSCOPIC OVARIAN CYSTECTOMY;  Surgeon: Benjaman Kindler, MD;  Location: [facility];  Service: Gynecology;  Laterality: Right;   LAPAROSCOPY N/A 04/30/2017   Procedure: LAPAROSCOPY DIAGNOSTIC;  Surgeon: Benjaman Kindler, MD;  Location: [facility];  Service: Gynecology;  Laterality: N/A;   LAPAROSCOPY N/A 05/28/2017    Procedure: LAPAROSCOPY OPERATIVE;  Surgeon: Benjaman Kindler, MD;  Location: [facility];  Service: Gynecology;  Laterality: N/A;   OVARIAN CYST REMOVAL Left 05/28/2017   Procedure: OVARIAN CYSTECTOMY;  Surgeon: Benjaman Kindler, MD;  Location: [facility];  Service: Gynecology;  Laterality: Left;   TENDON REPAIR Right    pinky finger    Family History  Problem Relation Age of Onset   Cancer Maternal Aunt        started in kidneys and spread to entire body    Social History   Socioeconomic History   Marital status: Married    Spouse name: Not on file   Number of children: Not on file   Years of education: Not on file   Highest education level: Not on file  Occupational History   Not on file  Tobacco Use   Smoking status: Never   Smokeless tobacco: Never  Vaping Use   Vaping Use: Never used  Substance and Sexual Activity   Alcohol use: Yes    Comment: occ.   Drug use: No   Sexual activity: Yes    Birth control/protection: Pill  Other Topics Concern   Not on file  Social History Narrative   Not on file   Social Determinants of Health   Financial Resource Strain: Not on file  Food Insecurity: Not on file  Transportation Needs: Not on file  Physical Activity: Not on file  Stress: Not on file  Social Connections: Not on file  Intimate  Partner Violence: Not on file     Current Outpatient Medications:    norgestrel-ethinyl estradiol (LOW-OGESTREL) 0.3-30 MG-MCG tablet, Take 1 tablet by mouth daily. CONTINUOUS DOSING, Disp: 84 tablet, Rfl: 3   triamcinolone (KENALOG) 0.025 % cream, Apply 1 application topically 2 (two) times daily as needed. (Patient not taking: Reported on 04/08/2021), Disp: , Rfl:      ROS:  Review of Systems  Constitutional:  Negative for fatigue, fever and unexpected weight change.  Respiratory:  Negative for cough, shortness of breath and wheezing.   Cardiovascular:  Negative for chest pain, palpitations and leg swelling.  Gastrointestinal:   Negative for blood in stool, constipation, diarrhea, nausea and vomiting.  Endocrine: Negative for cold intolerance, heat intolerance and polyuria.  Genitourinary:  Positive for dyspareunia. Negative for dysuria, flank pain, frequency, genital sores, hematuria, menstrual problem, pelvic pain, urgency, vaginal bleeding, vaginal discharge and vaginal pain.  Musculoskeletal:  Negative for back pain, joint swelling and myalgias.  Skin:  Negative for rash.  Neurological:  Negative for dizziness, syncope, light-headedness, numbness and headaches.  Hematological:  Negative for adenopathy.  Psychiatric/Behavioral:  Negative for agitation, confusion, sleep disturbance and suicidal ideas. The patient is not nervous/anxious.   BREAST: No symptoms   Objective: BP 110/60    Ht 5\' 3"  (1.6 m)    Wt 136 lb (61.7 kg)    BMI 24.09 kg/m    Physical Exam Constitutional:      Appearance: She is well-developed.  Genitourinary:     Vulva normal.     Right Labia: No rash, tenderness or lesions.    Left Labia: No tenderness, lesions or rash.    No vaginal discharge, erythema or tenderness.      Right Adnexa: not tender and no mass present.    Left Adnexa: not tender and no mass present.    No cervical friability or polyp.     Uterus is not enlarged or tender.  Breasts:    Right: No mass, nipple discharge, skin change or tenderness.     Left: No mass, nipple discharge, skin change or tenderness.  Neck:     Thyroid: No thyromegaly.  Cardiovascular:     Rate and Rhythm: Normal rate and regular rhythm.     Heart sounds: Normal heart sounds. No murmur heard. Pulmonary:     Effort: Pulmonary effort is normal.     Breath sounds: Normal breath sounds.  Abdominal:     Palpations: Abdomen is soft.     Tenderness: There is no abdominal tenderness. There is no guarding or rebound.  Musculoskeletal:        General: Normal range of motion.     Cervical back: Normal range of motion.  Lymphadenopathy:      Cervical: No cervical adenopathy.  Neurological:     General: No focal deficit present.     Mental Status: She is alert and oriented to person, place, and time.     Cranial Nerves: No cranial nerve deficit.  Skin:    General: Skin is warm and dry.  Psychiatric:        Mood and Affect: Mood normal.        Behavior: Behavior normal.        Thought Content: Thought content normal.        Judgment: Judgment normal.  Vitals reviewed.    Assessment/Plan: Encounter for annual routine gynecological examination  Cervical cancer screening - Plan: Cytology - PAP  Screening for HPV (human papillomavirus) - Plan: Cytology -  PAP  ASCUS of cervix with negative high risk HPV - Plan: Cytology - PAP; repeat pap today  Encounter for surveillance of contraceptive pills - Plan: norgestrel-ethinyl estradiol (LOW-OGESTREL) 0.3-30 MG-MCG tablet; OCP RF  History of ovarian cyst - Plan: norgestrel-ethinyl estradiol (LOW-OGESTREL) 0.3-30 MG-MCG tablet; controlled with OCPs  Dyspareunia in female - Plan: US PELVIC COMPLETE WITH TRANSVAGINAL; neg exam; check GYN u/s, had same sx with ovar cyst in past. Will f/u with results.    Meds ordered this encounter  Medications   norgestrel-ethinyl estradiol (LOW-OGESTREL) 0.3-30 MG-MCG tablet    Sig: Take 1 tablet by mouth daily. CONTINUOUS DOSING    Dispense:  84 tablet    Refill:  3    Order Specific Question:   Supervising Provider    Answer:   Nadara Mustard [[id number]]             GYN counsel adequate intake of calcium and vitamin D, diet and exercise     F/U  Return in about 1 year (around 04/08/2022).  Keaundre Thelin B. Fallan Mccarey, PA-C 04/08/2021 8:29 AM

## 2021-04-08 ENCOUNTER — Other Ambulatory Visit: Payer: Self-pay | Admitting: Obstetrics and Gynecology

## 2021-04-08 ENCOUNTER — Other Ambulatory Visit: Payer: Self-pay

## 2021-04-08 ENCOUNTER — Encounter: Payer: Self-pay | Admitting: Obstetrics and Gynecology

## 2021-04-08 ENCOUNTER — Ambulatory Visit (INDEPENDENT_AMBULATORY_CARE_PROVIDER_SITE_OTHER): Payer: Managed Care, Other (non HMO) | Admitting: Obstetrics and Gynecology

## 2021-04-08 ENCOUNTER — Other Ambulatory Visit (HOSPITAL_COMMUNITY)
Admission: RE | Admit: 2021-04-08 | Discharge: 2021-04-08 | Disposition: A | Discharge status: Home / Self Care | Payer: Managed Care, Other (non HMO) | Source: Ambulatory Visit | Attending: Obstetrics and Gynecology | Admitting: Obstetrics and Gynecology

## 2021-04-08 VITALS — BP 110/60 | Ht 63.0 in | Wt 136.0 lb

## 2021-04-08 DIAGNOSIS — Z01419 Encounter for gynecological examination (general) (routine) without abnormal findings: Secondary | ICD-10-CM | POA: Diagnosis not present

## 2021-04-08 DIAGNOSIS — Z1151 Encounter for screening for human papillomavirus (HPV): Secondary | ICD-10-CM

## 2021-04-08 DIAGNOSIS — R87610 Atypical squamous cells of undetermined significance on cytologic smear of cervix (ASC-US): Secondary | ICD-10-CM

## 2021-04-08 DIAGNOSIS — Z124 Encounter for screening for malignant neoplasm of cervix: Secondary | ICD-10-CM | POA: Insufficient documentation

## 2021-04-08 DIAGNOSIS — N941 Unspecified dyspareunia: Secondary | ICD-10-CM

## 2021-04-08 DIAGNOSIS — Z3041 Encounter for surveillance of contraceptive pills: Secondary | ICD-10-CM

## 2021-04-08 DIAGNOSIS — Z8742 Personal history of other diseases of the female genital tract: Secondary | ICD-10-CM

## 2021-04-08 MED ORDER — CYCLOBENZAPRINE HCL 10 MG PO TABS: 10.0000 mg | ORAL_TABLET | Freq: Two times a day (BID) | ORAL | 0 refills | Status: DC | PRN

## 2021-04-08 MED ORDER — LOW-OGESTREL 0.3-30 MG-MCG PO TABS: 1.0000 | ORAL_TABLET | Freq: Every day | ORAL | 3 refills | Status: DC

## 2021-04-08 NOTE — Patient Instructions (Signed)
I value your feedback and you entrusting us with your care. If you get a [place] patient survey, I would appreciate you taking the time to let us know about your experience today. Thank you! ? ? ?

## 2021-04-08 NOTE — Progress Notes (Signed)
Rx RF flexeril for TMJ flares.

## 2021-04-11 LAB — CYTOLOGY - PAP
Comment: NEGATIVE
Diagnosis: NEGATIVE
High risk HPV: NEGATIVE

## 2021-04-16 ENCOUNTER — Other Ambulatory Visit: Payer: Self-pay

## 2021-04-16 ENCOUNTER — Ambulatory Visit
Admission: RE | Admit: 2021-04-16 | Discharge: 2021-04-16 | Disposition: A | Discharge status: Home / Self Care | Payer: Managed Care, Other (non HMO) | Source: Ambulatory Visit | Attending: Obstetrics and Gynecology | Admitting: Obstetrics and Gynecology

## 2021-04-16 DIAGNOSIS — N941 Unspecified dyspareunia: Secondary | ICD-10-CM | POA: Diagnosis present

## 2021-04-18 ENCOUNTER — Ambulatory Visit: Payer: Managed Care, Other (non HMO)

## 2021-04-29 ENCOUNTER — Other Ambulatory Visit: Payer: Self-pay | Admitting: Obstetrics and Gynecology

## 2021-04-30 ENCOUNTER — Other Ambulatory Visit: Payer: Self-pay | Admitting: Obstetrics and Gynecology

## 2021-04-30 DIAGNOSIS — Z8742 Personal history of other diseases of the female genital tract: Secondary | ICD-10-CM

## 2021-04-30 DIAGNOSIS — Z3041 Encounter for surveillance of contraceptive pills: Secondary | ICD-10-CM

## 2021-05-05 ENCOUNTER — Other Ambulatory Visit: Payer: Self-pay | Admitting: Obstetrics and Gynecology

## 2022-01-09 ENCOUNTER — Other Ambulatory Visit: Payer: Self-pay | Admitting: Obstetrics and Gynecology

## 2022-01-09 ENCOUNTER — Encounter: Payer: Self-pay | Admitting: Obstetrics and Gynecology

## 2022-01-09 DIAGNOSIS — Z8742 Personal history of other diseases of the female genital tract: Secondary | ICD-10-CM

## 2022-01-09 DIAGNOSIS — Z3041 Encounter for surveillance of contraceptive pills: Secondary | ICD-10-CM

## 2022-01-26 ENCOUNTER — Other Ambulatory Visit: Payer: Self-pay | Admitting: Obstetrics and Gynecology

## 2022-01-26 DIAGNOSIS — Z8742 Personal history of other diseases of the female genital tract: Secondary | ICD-10-CM

## 2022-01-26 DIAGNOSIS — Z3041 Encounter for surveillance of contraceptive pills: Secondary | ICD-10-CM

## 2022-01-26 MED ORDER — LOW-OGESTREL 0.3-30 MG-MCG PO TABS: 1.0000 | ORAL_TABLET | Freq: Every day | ORAL | 3 refills | Status: DC

## 2022-01-26 MED ORDER — LOW-OGESTREL 0.3-30 MG-MCG PO TABS: 1.0000 | ORAL_TABLET | Freq: Every day | ORAL | 0 refills | Status: DC

## 2022-01-26 NOTE — Progress Notes (Signed)
Rx OCP change to 4 months at a time due to cont dosing per ins.

## 2022-01-26 NOTE — Telephone Encounter (Signed)
Pls advise.  

## 2022-03-20 HISTORY — PX: BREAST ENHANCEMENT SURGERY: SHX7

## 2022-07-27 NOTE — Progress Notes (Unsigned)
PCP:  Patient, No Pcp Per   No chief complaint on file.    HPI:      [name] is a 42 y.o. No obstetric history on file. whose LMP was No LMP recorded. (Menstrual status: Oral contraceptives)., presents today for her annual examination.  Her menses are absent due to continuous dosing of OCPs. Has occas spotting, no dysmen. Had ovar cysts with IUD in past. Pt is s/p lap ovarian cystectomy bilat, no cysts on OCPs.  Sex activity: single partner, contraception - OCP (estrogen/progesterone).  Has had midline sharp suprapubic pains with sex the past 1-2 months, no bleeding. Sx not every time. Had same sx with ovar cysts in past. No urin, GI , vag sx.   Last Pap: 04/08/21 Results were no abnormalities/ neg HPV DNA; 03/18/20 Results were ASCUS/neg HPV DNA; hx of abn pap in past with normal repeat; no tx  There is no FH of breast cancer. There is no FH of ovarian cancer. The patient does not do self-breast exams.  Tobacco use: The patient denies current or previous tobacco use. Alcohol use: social drinker No drug use.  Exercise: moderately active  She does get adequate calcium but not Vitamin D in her diet. Normal fasting labs 11/21   Past Medical History:  Diagnosis Date   Ovarian cyst 2019   LEFT   TMJ (dislocation of temporomandibular joint)     Past Surgical History:  Procedure Laterality Date   LAPAROSCOPIC OVARIAN CYSTECTOMY Bilateral 01/11/2017   Procedure: LAPAROSCOPIC OVARIAN CYSTECTOMY;  Surgeon: Christeen DouglasBeasley, Bethany, MD;  Location: [facility];  Service: Gynecology;  Laterality: Bilateral;   LAPAROSCOPIC OVARIAN CYSTECTOMY Right 04/30/2017   Procedure: LAPAROSCOPIC OVARIAN CYSTECTOMY;  Surgeon: Christeen DouglasBeasley, Bethany, MD;  Location: [facility];  Service: Gynecology;  Laterality: Right;   LAPAROSCOPY N/A 04/30/2017   Procedure: LAPAROSCOPY DIAGNOSTIC;  Surgeon: Christeen DouglasBeasley, Bethany, MD;  Location: [facility];  Service: Gynecology;  Laterality: N/A;   LAPAROSCOPY N/A 05/28/2017    Procedure: LAPAROSCOPY OPERATIVE;  Surgeon: Christeen DouglasBeasley, Bethany, MD;  Location: [facility];  Service: Gynecology;  Laterality: N/A;   OVARIAN CYST REMOVAL Left 05/28/2017   Procedure: OVARIAN CYSTECTOMY;  Surgeon: Christeen DouglasBeasley, Bethany, MD;  Location: [facility];  Service: Gynecology;  Laterality: Left;   TENDON REPAIR Right    pinky finger    Family History  Problem Relation Age of Onset   Cancer Maternal Aunt        started in kidneys and spread to entire body    Social History   Socioeconomic History   Marital status: Married    Spouse name: Not on file   Number of children: Not on file   Years of education: Not on file   Highest education level: Not on file  Occupational History   Not on file  Tobacco Use   Smoking status: Never   Smokeless tobacco: Never  Vaping Use   Vaping Use: Never used  Substance and Sexual Activity   Alcohol use: Yes    Comment: occ.   Drug use: No   Sexual activity: Yes    Birth control/protection: Pill  Other Topics Concern   Not on file  Social History Narrative   Not on file   Social Determinants of Health   Financial Resource Strain: Not on file  Food Insecurity: Not on file  Transportation Needs: Not on file  Physical Activity: Not on file  Stress: Not on file  Social Connections: Not on file  Intimate Partner Violence: Not  on file     Current Outpatient Medications:    cyclobenzaprine (FLEXERIL) 10 MG tablet, Take 1 tablet (10 mg total) by mouth 2 (two) times daily as needed for muscle spasms., Disp: 30 tablet, Rfl: 0   norgestrel-ethinyl estradiol (LOW-OGESTREL) 0.3-30 MG-MCG tablet, Take 1 tablet by mouth daily. CONTINUOUS DOSING, Disp: 112 tablet, Rfl: 3   triamcinolone (KENALOG) 0.025 % cream, Apply 1 application topically 2 (two) times daily as needed. (Patient not taking: Reported on 04/08/2021), Disp: , Rfl:      ROS:  Review of Systems  Constitutional:  Negative for fatigue, fever and unexpected weight change.  Respiratory:   Negative for cough, shortness of breath and wheezing.   Cardiovascular:  Negative for chest pain, palpitations and leg swelling.  Gastrointestinal:  Negative for blood in stool, constipation, diarrhea, nausea and vomiting.  Endocrine: Negative for cold intolerance, heat intolerance and polyuria.  Genitourinary:  Positive for dyspareunia. Negative for dysuria, flank pain, frequency, genital sores, hematuria, menstrual problem, pelvic pain, urgency, vaginal bleeding, vaginal discharge and vaginal pain.  Musculoskeletal:  Negative for back pain, joint swelling and myalgias.  Skin:  Negative for rash.  Neurological:  Negative for dizziness, syncope, light-headedness, numbness and headaches.  Hematological:  Negative for adenopathy.  Psychiatric/Behavioral:  Negative for agitation, confusion, sleep disturbance and suicidal ideas. The patient is not nervous/anxious.    BREAST: No symptoms   Objective: There were no vitals taken for this visit.   Physical Exam Constitutional:      Appearance: She is well-developed.  Genitourinary:     Vulva normal.     Right Labia: No rash, tenderness or lesions.    Left Labia: No tenderness, lesions or rash.    No vaginal discharge, erythema or tenderness.      Right Adnexa: not tender and no mass present.    Left Adnexa: not tender and no mass present.    No cervical friability or polyp.     Uterus is not enlarged or tender.  Breasts:    Right: No mass, nipple discharge, skin change or tenderness.     Left: No mass, nipple discharge, skin change or tenderness.  Neck:     Thyroid: No thyromegaly.  Cardiovascular:     Rate and Rhythm: Normal rate and regular rhythm.     Heart sounds: Normal heart sounds. No murmur heard. Pulmonary:     Effort: Pulmonary effort is normal.     Breath sounds: Normal breath sounds.  Abdominal:     Palpations: Abdomen is soft.     Tenderness: There is no abdominal tenderness. There is no guarding or rebound.   Musculoskeletal:        General: Normal range of motion.     Cervical back: Normal range of motion.  Lymphadenopathy:     Cervical: No cervical adenopathy.  Neurological:     General: No focal deficit present.     Mental Status: She is alert and oriented to person, place, and time.     Cranial Nerves: No cranial nerve deficit.  Skin:    General: Skin is warm and dry.  Psychiatric:        Mood and Affect: Mood normal.        Behavior: Behavior normal.        Thought Content: Thought content normal.        Judgment: Judgment normal.  Vitals reviewed.     Assessment/Plan: Encounter for annual routine gynecological examination  Cervical cancer screening - Plan: Cytology -  PAP  Screening for HPV (human papillomavirus) - Plan: Cytology - PAP  ASCUS of cervix with negative high risk HPV - Plan: Cytology - PAP; repeat pap today  Encounter for surveillance of contraceptive pills - Plan: norgestrel-ethinyl estradiol (LOW-OGESTREL) 0.3-30 MG-MCG tablet; OCP RF  History of ovarian cyst - Plan: norgestrel-ethinyl estradiol (LOW-OGESTREL) 0.3-30 MG-MCG tablet; controlled with OCPs  Dyspareunia in female - Plan: US PELVIC COMPLETE WITH TRANSVAGINAL; neg exam; check GYN u/s, had same sx with ovar cyst in past. Will f/u with results.    No orders of the defined types were placed in this encounter.            GYN counsel adequate intake of calcium and vitamin D, diet and exercise     F/U  No follow-ups on file.  Miyani Cronic B. Rudolpho Claxton, PA-C 07/27/2022 8:08 PM

## 2022-07-28 ENCOUNTER — Ambulatory Visit (INDEPENDENT_AMBULATORY_CARE_PROVIDER_SITE_OTHER): Payer: Managed Care, Other (non HMO) | Admitting: Obstetrics and Gynecology

## 2022-07-28 ENCOUNTER — Encounter: Payer: Self-pay | Admitting: Obstetrics and Gynecology

## 2022-07-28 VITALS — BP 108/70 | Ht 63.0 in | Wt 147.0 lb

## 2022-07-28 DIAGNOSIS — Z01419 Encounter for gynecological examination (general) (routine) without abnormal findings: Secondary | ICD-10-CM

## 2022-07-28 DIAGNOSIS — Z3041 Encounter for surveillance of contraceptive pills: Secondary | ICD-10-CM

## 2022-07-28 DIAGNOSIS — Z0000 Encounter for general adult medical examination without abnormal findings: Secondary | ICD-10-CM

## 2022-07-28 DIAGNOSIS — B354 Tinea corporis: Secondary | ICD-10-CM

## 2022-07-28 DIAGNOSIS — Z13220 Encounter for screening for lipoid disorders: Secondary | ICD-10-CM

## 2022-07-28 DIAGNOSIS — Z1231 Encounter for screening mammogram for malignant neoplasm of breast: Secondary | ICD-10-CM

## 2022-07-28 MED ORDER — FLUCONAZOLE 200 MG PO TABS: ORAL_TABLET | ORAL | 1 refills | Status: DC

## 2022-07-28 MED ORDER — LOW-OGESTREL 0.3-30 MG-MCG PO TABS: 1.0000 | ORAL_TABLET | Freq: Every day | ORAL | 3 refills | Status: DC

## 2022-07-28 NOTE — Patient Instructions (Signed)
I value your feedback and you entrusting us with your care. If you get a [facility] patient survey, I would appreciate you taking the time to let us know about your experience today. Thank you!  [facility]: [phone]  [facility]: [phone]    

## 2022-07-29 ENCOUNTER — Other Ambulatory Visit: Payer: Managed Care, Other (non HMO)

## 2022-07-29 DIAGNOSIS — Z0000 Encounter for general adult medical examination without abnormal findings: Secondary | ICD-10-CM

## 2022-07-29 DIAGNOSIS — Z13220 Encounter for screening for lipoid disorders: Secondary | ICD-10-CM

## 2022-07-30 LAB — LIPID PANEL WITH LDL/HDL RATIO
Cholesterol, Total: 181 mg/dL (ref 100–199)
HDL: 49 mg/dL (ref >39)
LDL Chol Calc (NIH): 119 mg/dL — ABNORMAL HIGH (ref 0–99)
LDL/HDL Ratio: 2.4 ratio (ref 0.0–3.2)
Triglycerides: 68 mg/dL (ref 0–149)
VLDL Cholesterol Cal: 13 mg/dL (ref 5–40)

## 2022-07-30 LAB — COMPREHENSIVE METABOLIC PANEL
ALT: 22 IU/L (ref 0–32)
AST: 21 IU/L (ref 0–40)
Albumin/Globulin Ratio: 2.2 (ref 1.2–2.2)
Albumin: 3.9 g/dL (ref 3.9–4.9)
Alkaline Phosphatase: 38 IU/L — ABNORMAL LOW (ref 44–121)
BUN/Creatinine Ratio: 20 (ref 9–23)
BUN: 13 mg/dL (ref 6–24)
Bilirubin Total: 0.3 mg/dL (ref 0.0–1.2)
CO2: 22 mmol/L (ref 20–29)
Calcium: 8.7 mg/dL (ref 8.7–10.2)
Chloride: 106 mmol/L (ref 96–106)
Creatinine, Ser: 0.64 mg/dL (ref 0.57–1.00)
Globulin, Total: 1.8 g/dL (ref 1.5–4.5)
Glucose: 83 mg/dL (ref 70–99)
Potassium: 4.4 mmol/L (ref 3.5–5.2)
Sodium: 140 mmol/L (ref 134–144)
Total Protein: 5.7 g/dL — ABNORMAL LOW (ref 6.0–8.5)
eGFR: 114 mL/min/{1.73_m2} (ref >59)

## 2022-12-22 ENCOUNTER — Encounter: Payer: Self-pay | Admitting: Obstetrics and Gynecology

## 2022-12-22 DIAGNOSIS — R635 Abnormal weight gain: Secondary | ICD-10-CM

## 2022-12-22 DIAGNOSIS — Z1329 Encounter for screening for other suspected endocrine disorder: Secondary | ICD-10-CM

## 2022-12-23 ENCOUNTER — Other Ambulatory Visit: Payer: Managed Care, Other (non HMO)

## 2022-12-23 DIAGNOSIS — R635 Abnormal weight gain: Secondary | ICD-10-CM

## 2022-12-23 DIAGNOSIS — Z1329 Encounter for screening for other suspected endocrine disorder: Secondary | ICD-10-CM

## 2022-12-24 LAB — THYROID PANEL WITH TSH
Free Thyroxine Index: 1.4 (ref 1.2–4.9)
T3 Uptake Ratio: 15 % — ABNORMAL LOW (ref 24–39)
T4, Total: 9.3 ug/dL (ref 4.5–12.0)
TSH: 1.27 u[IU]/mL (ref 0.450–4.500)

## 2023-01-22 ENCOUNTER — Other Ambulatory Visit: Payer: Self-pay | Admitting: Obstetrics and Gynecology

## 2023-01-22 DIAGNOSIS — Z3041 Encounter for surveillance of contraceptive pills: Secondary | ICD-10-CM

## 2023-02-06 ENCOUNTER — Ambulatory Visit (HOSPITAL_BASED_OUTPATIENT_CLINIC_OR_DEPARTMENT_OTHER)
Admission: RE | Admit: 2023-02-06 | Payer: Managed Care, Other (non HMO) | Source: Ambulatory Visit | Admitting: Radiology

## 2023-02-06 DIAGNOSIS — Z1231 Encounter for screening mammogram for malignant neoplasm of breast: Secondary | ICD-10-CM

## 2023-02-11 ENCOUNTER — Ambulatory Visit (HOSPITAL_BASED_OUTPATIENT_CLINIC_OR_DEPARTMENT_OTHER)
Admission: RE | Admit: 2023-02-11 | Discharge: 2023-02-11 | Disposition: A | Discharge status: Home / Self Care | Payer: Managed Care, Other (non HMO) | Source: Ambulatory Visit | Attending: Obstetrics and Gynecology | Admitting: Obstetrics and Gynecology

## 2023-02-11 ENCOUNTER — Other Ambulatory Visit (HOSPITAL_BASED_OUTPATIENT_CLINIC_OR_DEPARTMENT_OTHER): Payer: Self-pay

## 2023-02-11 DIAGNOSIS — Z1231 Encounter for screening mammogram for malignant neoplasm of breast: Secondary | ICD-10-CM

## 2023-08-24 ENCOUNTER — Ambulatory Visit

## 2023-08-24 ENCOUNTER — Other Ambulatory Visit: Payer: Self-pay | Admitting: Obstetrics and Gynecology

## 2023-08-24 ENCOUNTER — Other Ambulatory Visit (HOSPITAL_COMMUNITY)
Admission: RE | Admit: 2023-08-24 | Discharge: 2023-08-24 | Disposition: A | Discharge status: Home / Self Care | Source: Ambulatory Visit | Attending: Obstetrics and Gynecology | Admitting: Obstetrics and Gynecology

## 2023-08-24 VITALS — BP 145/76 | HR 76 | Ht 63.0 in | Wt 160.8 lb

## 2023-08-24 DIAGNOSIS — N898 Other specified noninflammatory disorders of vagina: Secondary | ICD-10-CM | POA: Insufficient documentation

## 2023-08-24 DIAGNOSIS — Z1329 Encounter for screening for other suspected endocrine disorder: Secondary | ICD-10-CM

## 2023-08-24 DIAGNOSIS — Z0000 Encounter for general adult medical examination without abnormal findings: Secondary | ICD-10-CM

## 2023-08-24 NOTE — Progress Notes (Signed)
 Labs due to syncopal episode a few days ago. BP was 104/???. Most likely vasovagal.

## 2023-08-24 NOTE — Patient Instructions (Signed)
 Vaginal Infection (Bacterial Vaginosis): What to Know  Bacterial vaginosis is an infection of the vagina. It happens when the balance of normal germs (bacteria) in the vagina changes. If you don't get treated, it can make it easier for you to get other infections from sex. These are called sexually transmitted infections (STIs). If you're pregnant, you need to get treated right away. This infection can cause a baby to be born early or at a low birth weight. What are the causes? This infection happens when too many harmful germs grow in the vagina. You can't get this infection from toilet seats, bedsheets, swimming pools, or things that touch your vagina. What increases the risk? Having sex with a new person or more than one person. Having sex without protection. Douching. Having an intrauterine device (IUD). Smoking. Using drugs or drinking alcohol. These can lead you to do risky things. Taking certain antibiotics. Being pregnant. What are the signs or symptoms? Some females have no symptoms. Symptoms may include: A gray or white discharge from your vagina. It can be watery or foamy. A fishy smell. This can happen after sex or during your menstrual period. Itching in and around your vagina. Burning or pain when you pee. How is this treated? This infection is treated with antibiotics. These may be given to you as: A pill. A cream for your vagina. A medicine that you put into your vagina (suppository). If the infection comes back, you may need more antibiotics. Follow these instructions at home: Medicines Take your medicines as told. Take or use your antibiotics as told. Do not stop using them even if you start to feel better. General instructions If the person you have sex with is a female, tell her that you have this infection. She will need to follow up with her doctor. Female partners don't need to be treated. Do not have sex until you finish treatment. Drink more fluids as  told. Keep your vagina and butt clean. Wash these areas with warm water each day. Wipe from front to back after you poop. If you're breastfeeding a baby, talk to your doctor if you should keep doing so during treatment. How is this prevented? Self-care Do not douche. Do not use deodorant sprays on your vagina. Wear cotton underwear. Do not wear tight pants and pantyhose, especially in the summer. Safe sex Use condoms the correct way and every time you have sex. Use dental dams to protect yourself during oral sex. Limit how many people you have sex with. Get tested for STIs. The person you have sex with should also get tested. Drugs and alcohol Do not smoke, vape, or use nicotine or tobacco. Do not use drugs. Limit the amount of alcohol you drink because it can lead you to do risky things. Where to find more information To learn more: Go to TonerPromos.no. Click Health Topics A-Z. Type "bacterial vaginosis" in the search bar. American Sexual Health Association (ASHA): ashasexualhealth.org U.S. Department of Health and [employer], Office on Women's Health: TravelLesson.ca Contact a doctor if: Your symptoms don't get better, even after treatment. You have more discharge or pain when you pee. You have a fever or chills. You have pain in your belly or in the area between your hips. You have pain during sex. You bleed from your vagina between menstrual periods. This information is not intended to replace advice given to you by your health care provider. Make sure you discuss any questions you have with your health care provider. Document  Revised: 09/23/2022 Document Reviewed: 09/23/2022 Elsevier Patient Education  2024 [employer].

## 2023-08-24 NOTE — Progress Notes (Signed)
    NURSE VISIT NOTE  Subjective:    Patient ID: Dana Baldwin, female    DOB: Aug 23, 1980, 43 y.o.   MRN: [id number]  HPI  Patient is a 43 y.o. G32P2002 female who presents for vaginal odor and itching  for 3 day(s). Denies abnormal vaginal bleeding or significant pelvic pain or fever. denies dysuria, hematuria, urinary frequency, urinary urgency, flank pain, abdominal pain, and pelvic pain. Patient denies history of known exposure to STD.   Objective:    BP (!) 145/76   Pulse 76   Ht 5\' 3"  (1.6 m)   Wt 160 lb 12.8 oz (72.9 kg)   LMP  (Within Months) Comment: patient reports that she continuously takes birth control and skips placebo  BMI 28.48 kg/m     Assessment:   1. Vaginal odor     nonspecific vaginitis  Plan:   GC and chlamydia DNA  probe sent to lab. ROV prn if symptoms persist or worsen.   Inga Manges, CMA

## 2023-08-25 ENCOUNTER — Encounter: Payer: Self-pay | Admitting: Obstetrics and Gynecology

## 2023-08-25 ENCOUNTER — Other Ambulatory Visit: Payer: Self-pay

## 2023-08-25 DIAGNOSIS — N898 Other specified noninflammatory disorders of vagina: Secondary | ICD-10-CM

## 2023-08-25 LAB — CERVICOVAGINAL ANCILLARY ONLY
Bacterial Vaginitis (gardnerella): NEGATIVE
Candida Glabrata: NEGATIVE
Candida Vaginitis: NEGATIVE
Chlamydia: NEGATIVE
Comment: NEGATIVE
Comment: NEGATIVE
Comment: NEGATIVE
Comment: NEGATIVE
Comment: NEGATIVE
Comment: NORMAL
Neisseria Gonorrhea: NEGATIVE
Trichomonas: NEGATIVE

## 2023-08-25 MED ORDER — FLUCONAZOLE 150 MG PO TABS: 150.0000 mg | ORAL_TABLET | Freq: Every day | ORAL | 0 refills | Status: DC

## 2023-08-25 NOTE — Progress Notes (Deleted)
 Pcp, No   No chief complaint on file.   HPI:      [name] is a 43 y.o. [id number] whose LMP was No LMP recorded (within months). (Menstrual status: Oral contraceptives)., presents today for vaginal itching; diflucan  eRxd while waiting for culture reseults. Neg vaginitis cultre yesterda    Patient Active Problem List   Diagnosis Date Noted   ASCUS of cervix with negative high risk HPV 04/07/2021   Allergic rhinitis 05/21/2020   Bilateral ovarian cysts 05/21/2020   History of ovarian cyst 05/21/2020   Injury of flexor tendon of hand 05/21/2020   Laceration of finger 05/21/2020   LLQ pain 05/21/2020   Pelvic pain in female 05/21/2020    Past Surgical History:  Procedure Laterality Date   BREAST ENHANCEMENT SURGERY  03/2022   LAPAROSCOPIC OVARIAN CYSTECTOMY Bilateral 01/11/2017   Procedure: LAPAROSCOPIC OVARIAN CYSTECTOMY;  Surgeon: Prescilla Brod, MD;  Location: [facility];  Service: Gynecology;  Laterality: Bilateral;   LAPAROSCOPIC OVARIAN CYSTECTOMY Right 04/30/2017   Procedure: LAPAROSCOPIC OVARIAN CYSTECTOMY;  Surgeon: Prescilla Brod, MD;  Location: [facility];  Service: Gynecology;  Laterality: Right;   LAPAROSCOPY N/A 04/30/2017   Procedure: LAPAROSCOPY DIAGNOSTIC;  Surgeon: Prescilla Brod, MD;  Location: [facility];  Service: Gynecology;  Laterality: N/A;   LAPAROSCOPY N/A 05/28/2017   Procedure: LAPAROSCOPY OPERATIVE;  Surgeon: Prescilla Brod, MD;  Location: [facility];  Service: Gynecology;  Laterality: N/A;   OVARIAN CYST REMOVAL Left 05/28/2017   Procedure: OVARIAN CYSTECTOMY;  Surgeon: Prescilla Brod, MD;  Location: [facility];  Service: Gynecology;  Laterality: Left;   TENDON REPAIR Right    pinky finger    Family History  Problem Relation Age of Onset   Cancer Maternal Aunt        started in kidneys and spread to entire body    Social History   Socioeconomic History   Marital status: Married    Spouse name: Not on file   Number of  children: Not on file   Years of education: Not on file   Highest education level: Not on file  Occupational History   Not on file  Tobacco Use   Smoking status: Never   Smokeless tobacco: Never  Vaping Use   Vaping status: Never Used  Substance and Sexual Activity   Alcohol use: Yes    Comment: occ.   Drug use: No   Sexual activity: Yes    Birth control/protection: Pill  Other Topics Concern   Not on file  Social History Narrative   Not on file   Social Drivers of Health   Financial Resource Strain: Not on file  Food Insecurity: Not on file  Transportation Needs: Not on file  Physical Activity: Not on file  Stress: Not on file  Social Connections: Not on file  Intimate Partner Violence: Not on file    Outpatient Medications Prior to Visit  Medication Sig Dispense Refill   fluconazole  (DIFLUCAN ) 150 MG tablet Take 1 tablet (150 mg total) by mouth daily. 1 tablet 0   fluconazole  (DIFLUCAN ) 200 MG tablet Take 2 tabs, wait a week, then repeat 4 tablet 1   norgestrel-ethinyl estradiol (LOW-OGESTREL ) 0.3-30 MG-MCG tablet Take 1 tablet by mouth daily. CONTINUOUS DOSING 112 tablet 3   No facility-administered medications prior to visit.      ROS:  Review of Systems BREAST: No symptoms   OBJECTIVE:   Vitals:  LMP  (Within Months)   Physical Exam  Results: No results  found for this or any previous visit (from the past 24 hours).   Assessment/Plan: No diagnosis found.    No orders of the defined types were placed in this encounter.     No follow-ups on file.  Irmgard Rampersaud B. Jaci Desanto, PA-C 08/25/2023 8:46 PM

## 2023-08-26 ENCOUNTER — Ambulatory Visit: Admitting: Obstetrics and Gynecology

## 2023-08-27 ENCOUNTER — Other Ambulatory Visit

## 2023-08-27 DIAGNOSIS — Z0000 Encounter for general adult medical examination without abnormal findings: Secondary | ICD-10-CM

## 2023-08-27 DIAGNOSIS — Z1329 Encounter for screening for other suspected endocrine disorder: Secondary | ICD-10-CM

## 2023-08-28 ENCOUNTER — Encounter: Payer: Self-pay | Admitting: Obstetrics and Gynecology

## 2023-08-28 LAB — TSH+FREE T4
Free T4: 1.1 ng/dL (ref 0.82–1.77)
TSH: 1.96 u[IU]/mL (ref 0.450–4.500)

## 2023-08-28 LAB — COMPREHENSIVE METABOLIC PANEL WITH GFR
ALT: 17 IU/L (ref 0–32)
AST: 17 IU/L (ref 0–40)
Albumin: 3.8 g/dL — ABNORMAL LOW (ref 3.9–4.9)
Alkaline Phosphatase: 43 IU/L — ABNORMAL LOW (ref 44–121)
BUN/Creatinine Ratio: 20 (ref 9–23)
BUN: 15 mg/dL (ref 6–24)
Bilirubin Total: 0.4 mg/dL (ref 0.0–1.2)
CO2: 23 mmol/L (ref 20–29)
Calcium: 8.6 mg/dL — ABNORMAL LOW (ref 8.7–10.2)
Chloride: 106 mmol/L (ref 96–106)
Creatinine, Ser: 0.76 mg/dL (ref 0.57–1.00)
Globulin, Total: 1.9 g/dL (ref 1.5–4.5)
Glucose: 107 mg/dL — ABNORMAL HIGH (ref 70–99)
Potassium: 4.9 mmol/L (ref 3.5–5.2)
Sodium: 141 mmol/L (ref 134–144)
Total Protein: 5.7 g/dL — ABNORMAL LOW (ref 6.0–8.5)
eGFR: 101 mL/min/{1.73_m2} (ref >59)

## 2023-08-28 LAB — CBC WITH DIFFERENTIAL/PLATELET
Basophils Absolute: 0 10*3/uL (ref 0.0–0.2)
Basos: 1 %
EOS (ABSOLUTE): 0.1 10*3/uL (ref 0.0–0.4)
Eos: 2 %
Hematocrit: 47.3 % — ABNORMAL HIGH (ref 34.0–46.6)
Hemoglobin: 15.9 g/dL (ref 11.1–15.9)
Immature Grans (Abs): 0 10*3/uL (ref 0.0–0.1)
Immature Granulocytes: 1 %
Lymphocytes Absolute: 1.8 10*3/uL (ref 0.7–3.1)
Lymphs: 28 %
MCH: 31.4 pg (ref 26.6–33.0)
MCHC: 33.6 g/dL (ref 31.5–35.7)
MCV: 93 fL (ref 79–97)
Monocytes Absolute: 0.4 10*3/uL (ref 0.1–0.9)
Monocytes: 6 %
Neutrophils Absolute: 3.9 10*3/uL (ref 1.4–7.0)
Neutrophils: 62 %
Platelets: 287 10*3/uL (ref 150–450)
RBC: 5.07 x10E6/uL (ref 3.77–5.28)
RDW: 11.8 % (ref 11.7–15.4)
WBC: 6.3 10*3/uL (ref 3.4–10.8)

## 2023-09-28 ENCOUNTER — Other Ambulatory Visit: Payer: Self-pay | Admitting: Obstetrics and Gynecology

## 2023-09-28 DIAGNOSIS — Z3041 Encounter for surveillance of contraceptive pills: Secondary | ICD-10-CM

## 2023-10-02 ENCOUNTER — Encounter: Payer: Self-pay | Admitting: Obstetrics and Gynecology

## 2023-10-02 DIAGNOSIS — Z3041 Encounter for surveillance of contraceptive pills: Secondary | ICD-10-CM

## 2023-10-04 MED ORDER — LOW-OGESTREL 0.3-30 MG-MCG PO TABS: 1.0000 | ORAL_TABLET | Freq: Every day | ORAL | 0 refills | Status: DC

## 2023-10-07 IMAGING — US US PELVIS COMPLETE WITH TRANSVAGINAL
1 series · 14 of 25 positions shown · non-contrast
Comparison: None

CLINICAL DATA: Dyspareunia

EXAM:
TRANSABDOMINAL AND TRANSVAGINAL ULTRASOUND OF PELVIS
TECHNIQUE: Both transabdominal and transvaginal ultrasound examinations of the
pelvis were performed. Transabdominal technique was performed for
global imaging of the pelvis including uterus, ovaries, adnexal
regions, and pelvic cul-de-sac. It was necessary to proceed with
endovaginal exam following the transabdominal exam to visualize the
uterus endometrium ovaries.

[Series 1: us pelvis complete with transvaginal · 0.18mm/px · 14 of 71 slices shown]
[im 1/71]
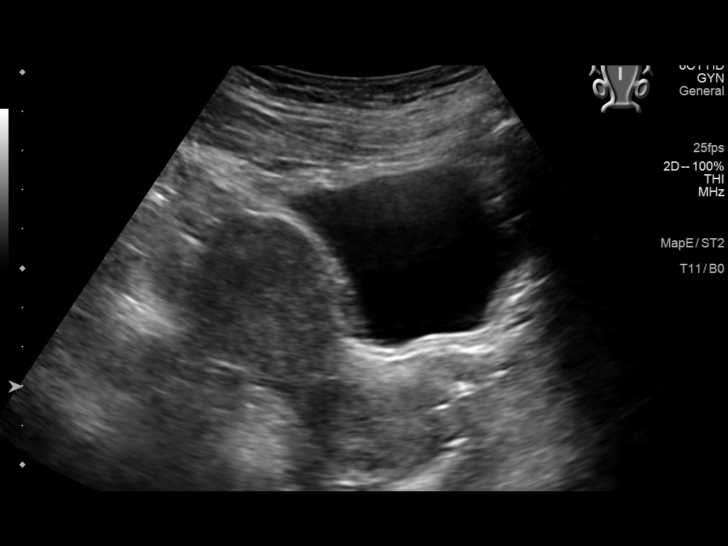
[im 6/71]
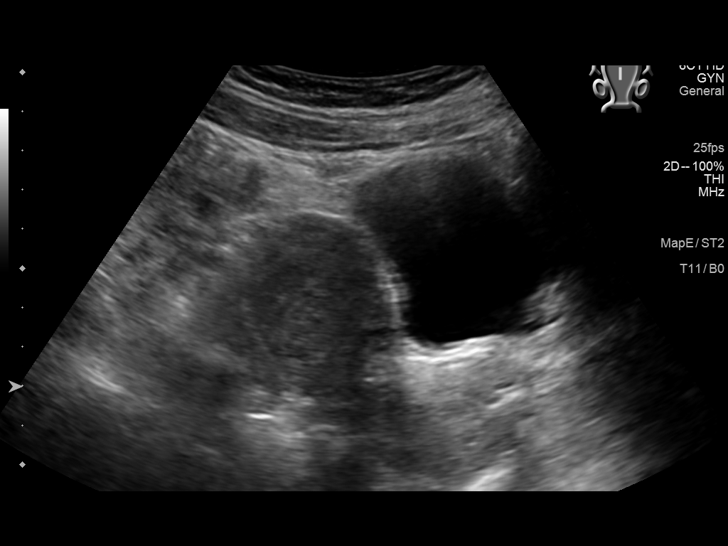
[im 12/71]
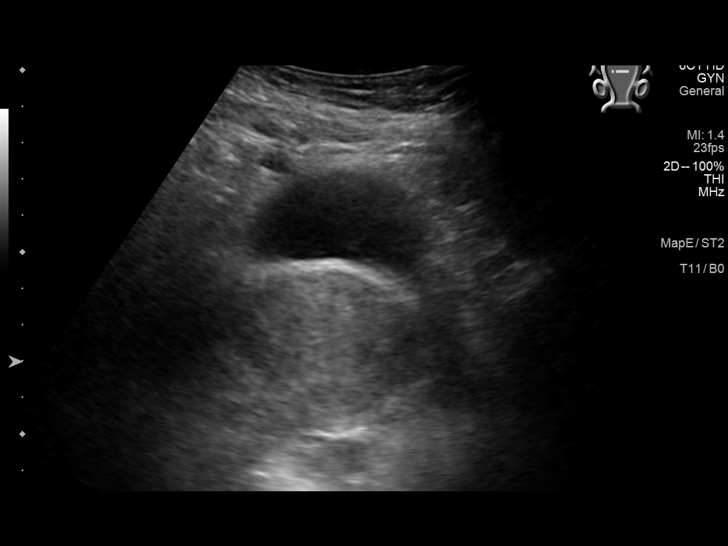
[im 18/71]
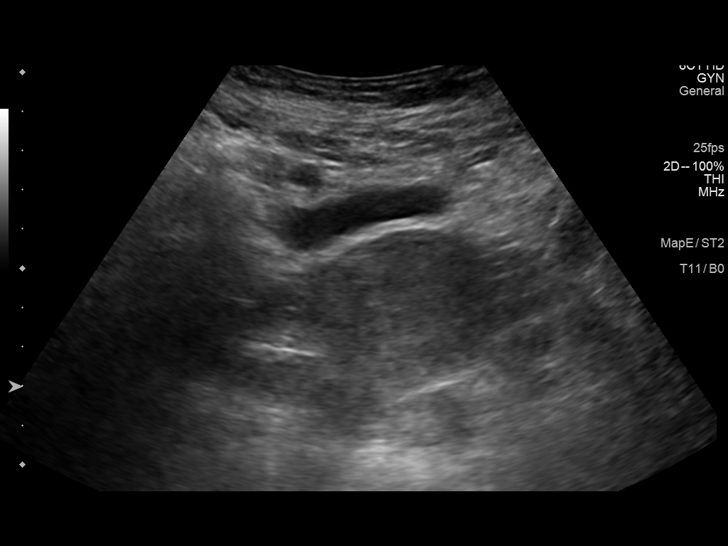
[im 24/71]
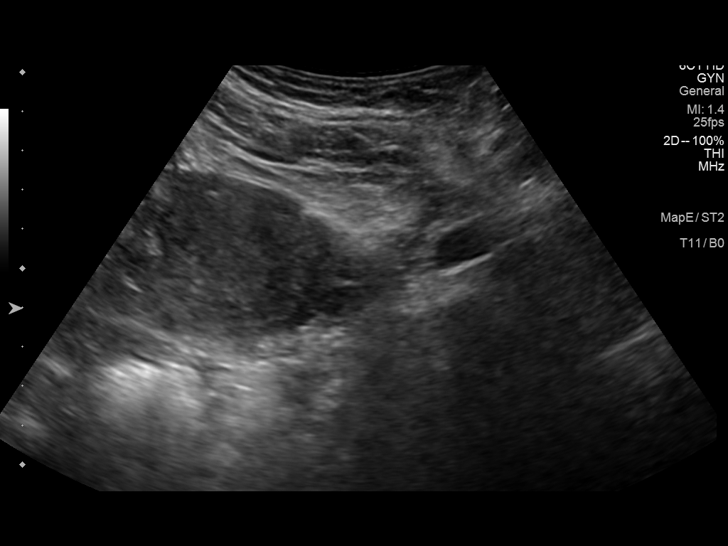
[im 27/71]
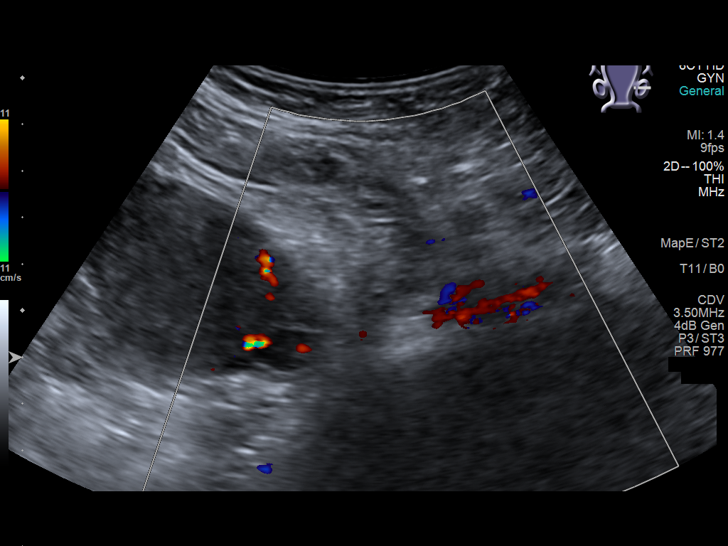
[im 33/71]
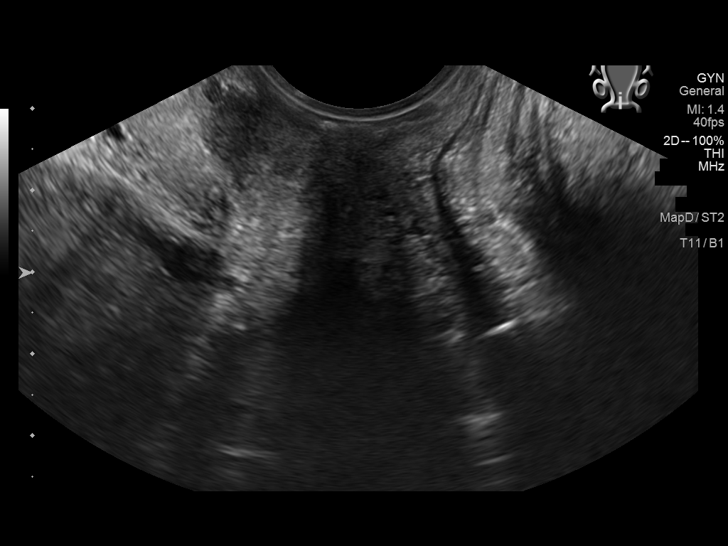
[im 38/71]
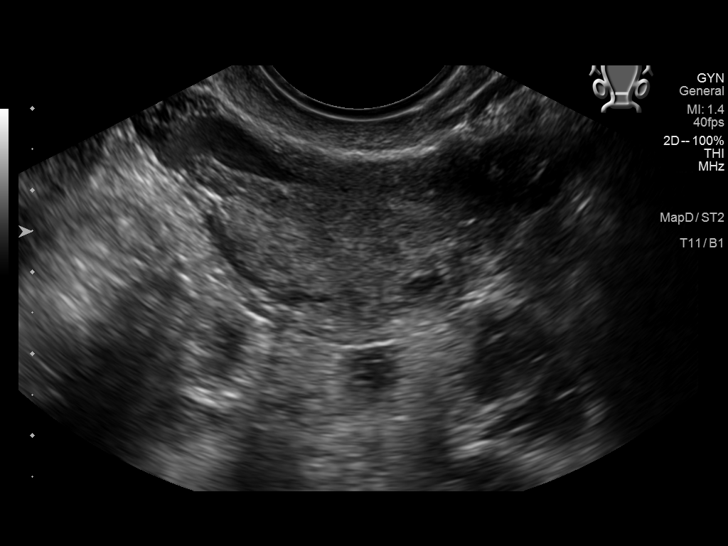
[im 44/71]
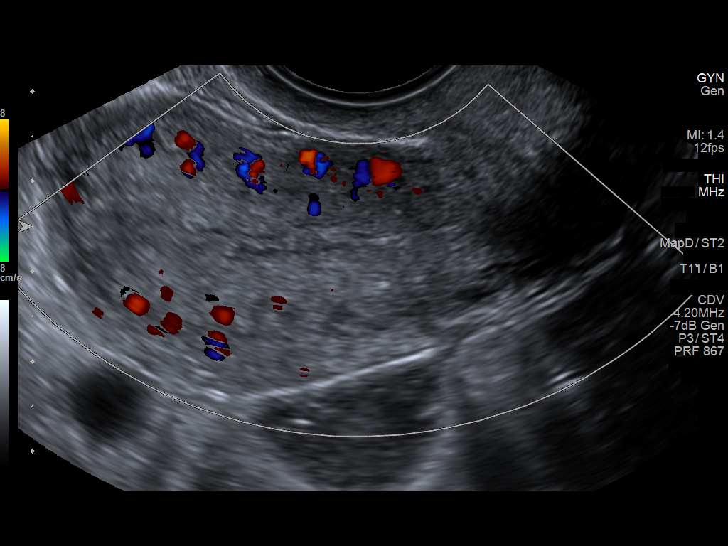
[im 47/71]
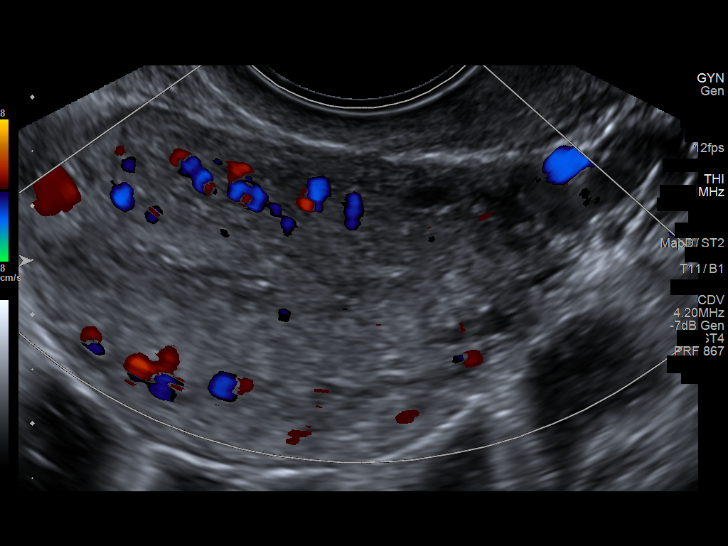
[im 53/71]
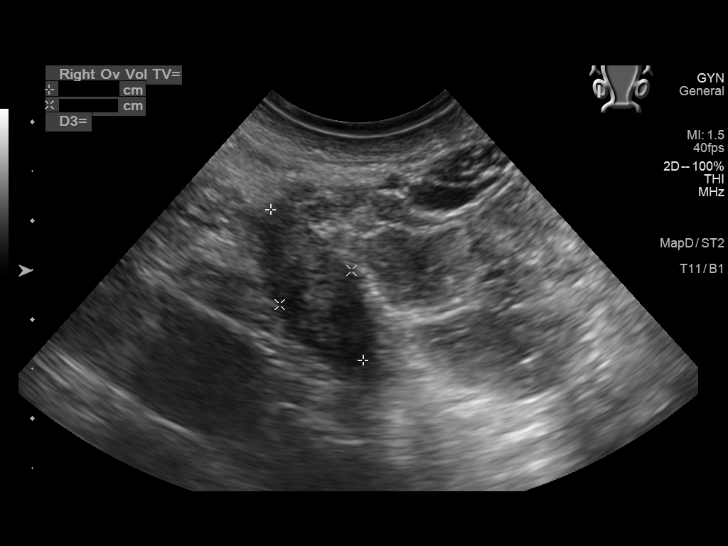
[im 59/71]
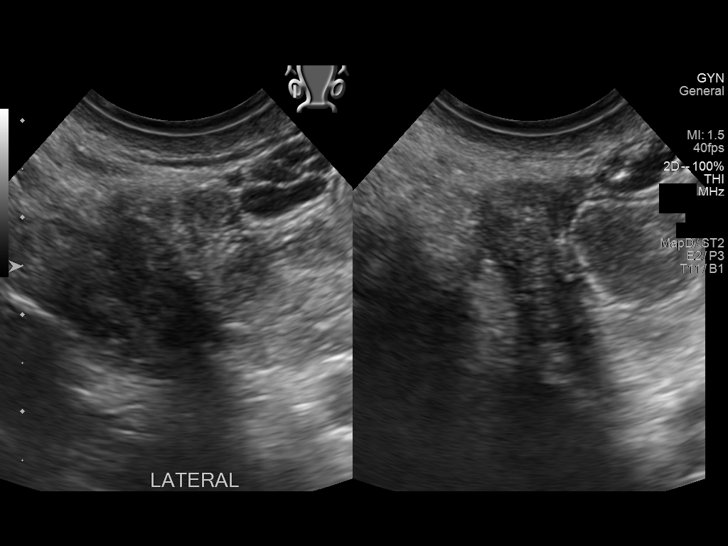
[im 65/71]
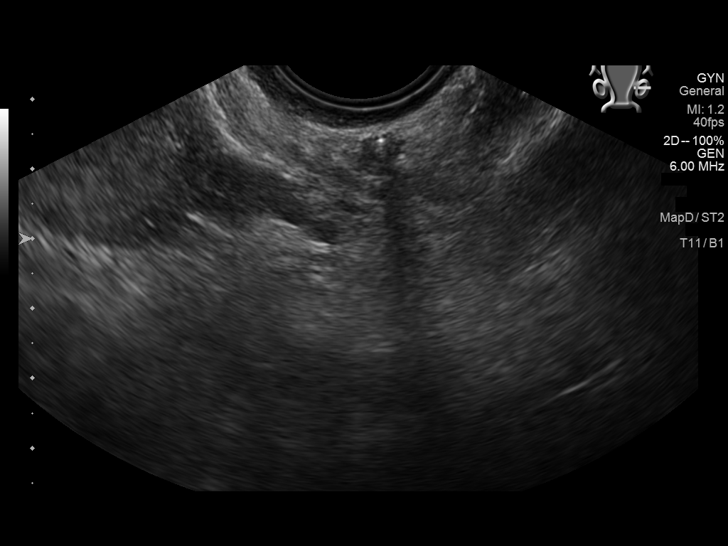
[im 71/71]
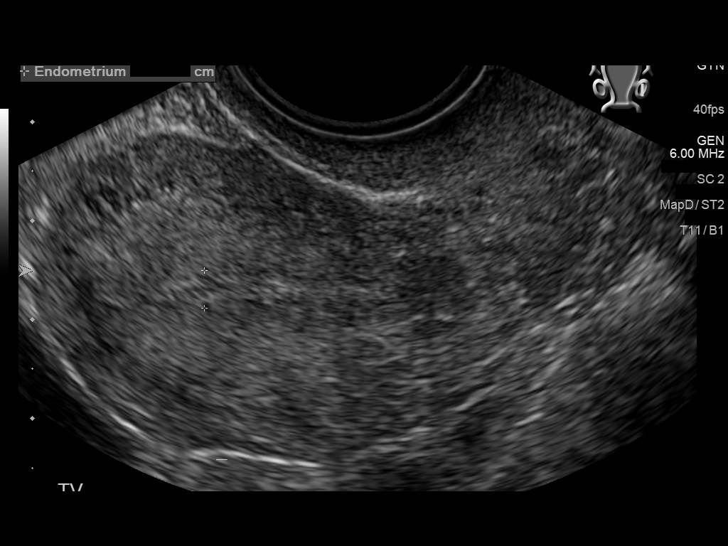

[14 of 25 positions shown; findings below may reference images not displayed]

FINDINGS: Uterus

Measurements: 8.2 x 3.4 x 4.5 cm = volume: 65.1 mL. No fibroids or
other mass visualized.

Endometrium

Thickness: 5.7 mm.  No focal abnormality visualized.

Right ovary

Measurements: 1.8 x 0.8 x 1.4 cm = volume: 1.0 mL. Normal
appearance/no adnexal mass.

Left ovary

Not visualized.

Other findings

No abnormal free fluid.
IMPRESSION: 1. Nonvisualized left ovary.
2. Otherwise negative pelvic ultrasound.

## 2023-10-21 ENCOUNTER — Other Ambulatory Visit: Payer: Self-pay | Admitting: Obstetrics and Gynecology

## 2023-10-21 ENCOUNTER — Other Ambulatory Visit: Payer: Self-pay

## 2023-10-21 DIAGNOSIS — Z3041 Encounter for surveillance of contraceptive pills: Secondary | ICD-10-CM

## 2023-10-21 MED ORDER — LOW-OGESTREL 0.3-30 MG-MCG PO TABS: 1.0000 | ORAL_TABLET | Freq: Every day | ORAL | 0 refills | Status: DC

## 2023-10-27 NOTE — Progress Notes (Unsigned)
 PCP:  Pcp, No   No chief complaint on file.    HPI:      [name] is a 43 y.o. No obstetric history on file. whose LMP was No LMP recorded. (Menstrual status: Oral contraceptives)., presents today for her annual examination.  Her menses are absent due to continuous dosing of OCPs. Has occas spotting, no dysmen. Had ovar cysts with IUD in past. Pt is s/p lap ovarian cystectomy bilat, no cysts on OCPs.  Sex activity: single partner, contraception - OCP (estrogen/progesterone).  No pain/bleeding/dryness.   Last Pap: 04/08/21 Results were no abnormalities/ neg HPV DNA; 03/18/20 Results were ASCUS/neg HPV DNA; hx of abn pap in past with normal repeat; no tx  There is no FH of breast cancer. There is no FH of ovarian cancer. The patient does not do self-breast exams.  Tobacco use: The patient denies current or previous tobacco use. Alcohol use: social drinker No drug use.  Exercise: moderately active  She does get adequate calcium and Vitamin D in her diet. Labs done 5/25, borderline lipids 4/24  Hx of tinea corporis on back. Having lots of itching. Would like RF on diflucan , used to treat by derm in past.    Past Medical History:  Diagnosis Date   Ovarian cyst 2019   LEFT   TMJ (dislocation of temporomandibular joint)     Past Surgical History:  Procedure Laterality Date   BREAST ENHANCEMENT SURGERY  03/2022   LAPAROSCOPIC OVARIAN CYSTECTOMY Bilateral 01/11/2017   Procedure: LAPAROSCOPIC OVARIAN CYSTECTOMY;  Surgeon: Verdon Keen, MD;  Location: [facility];  Service: Gynecology;  Laterality: Bilateral;   LAPAROSCOPIC OVARIAN CYSTECTOMY Right 04/30/2017   Procedure: LAPAROSCOPIC OVARIAN CYSTECTOMY;  Surgeon: Verdon Keen, MD;  Location: [facility];  Service: Gynecology;  Laterality: Right;   LAPAROSCOPY N/A 04/30/2017   Procedure: LAPAROSCOPY DIAGNOSTIC;  Surgeon: Verdon Keen, MD;  Location: [facility];  Service: Gynecology;  Laterality: N/A;   LAPAROSCOPY  N/A 05/28/2017   Procedure: LAPAROSCOPY OPERATIVE;  Surgeon: Verdon Keen, MD;  Location: [facility];  Service: Gynecology;  Laterality: N/A;   OVARIAN CYST REMOVAL Left 05/28/2017   Procedure: OVARIAN CYSTECTOMY;  Surgeon: Verdon Keen, MD;  Location: [facility];  Service: Gynecology;  Laterality: Left;   TENDON REPAIR Right    pinky finger    Family History  Problem Relation Age of Onset   Cancer Maternal Aunt        started in kidneys and spread to entire body    Social History   Socioeconomic History   Marital status: Married    Spouse name: Not on file   Number of children: Not on file   Years of education: Not on file   Highest education level: Not on file  Occupational History   Not on file  Tobacco Use   Smoking status: Never   Smokeless tobacco: Never  Vaping Use   Vaping status: Never Used  Substance and Sexual Activity   Alcohol use: Yes    Comment: occ.   Drug use: No   Sexual activity: Yes    Birth control/protection: Pill  Other Topics Concern   Not on file  Social History Narrative   Not on file   Social Drivers of Health   Financial Resource Strain: Not on file  Food Insecurity: Not on file  Transportation Needs: Not on file  Physical Activity: Not on file  Stress: Not on file  Social Connections: Not on file  Intimate Partner Violence: Not  on file     Current Outpatient Medications:    fluconazole  (DIFLUCAN ) 150 MG tablet, Take 1 tablet (150 mg total) by mouth daily., Disp: 1 tablet, Rfl: 0   fluconazole  (DIFLUCAN ) 200 MG tablet, Take 2 tabs, wait a week, then repeat, Disp: 4 tablet, Rfl: 1   norgestrel-ethinyl estradiol (LOW-OGESTREL ) 0.3-30 MG-MCG tablet, Take 1 tablet by mouth daily. CONTINUOUS DOSING, Disp: 28 tablet, Rfl: 0     ROS:  Review of Systems  Constitutional:  Negative for fatigue, fever and unexpected weight change.  Respiratory:  Negative for cough, shortness of breath and wheezing.   Cardiovascular:  Negative for  chest pain, palpitations and leg swelling.  Gastrointestinal:  Negative for blood in stool, constipation, diarrhea, nausea and vomiting.  Endocrine: Negative for cold intolerance, heat intolerance and polyuria.  Genitourinary:  Negative for dyspareunia, dysuria, flank pain, frequency, genital sores, hematuria, menstrual problem, pelvic pain, urgency, vaginal bleeding, vaginal discharge and vaginal pain.  Musculoskeletal:  Negative for back pain, joint swelling and myalgias.  Skin:  Negative for rash.  Neurological:  Negative for dizziness, syncope, light-headedness, numbness and headaches.  Hematological:  Negative for adenopathy.  Psychiatric/Behavioral:  Negative for agitation, confusion, sleep disturbance and suicidal ideas. The patient is not nervous/anxious.    BREAST: No symptoms   Objective: There were no vitals taken for this visit.   Physical Exam Constitutional:      Appearance: She is well-developed.  Genitourinary:     Vulva normal.     Right Labia: No rash, tenderness or lesions.    Left Labia: No tenderness, lesions or rash.    No vaginal discharge, erythema or tenderness.      Right Adnexa: not tender and no mass present.    Left Adnexa: not tender and no mass present.    No cervical friability or polyp.     Uterus is not enlarged or tender.  Breasts:    Right: No mass, nipple discharge, skin change or tenderness.     Left: No mass, nipple discharge, skin change or tenderness.  Neck:     Thyroid : No thyromegaly.  Cardiovascular:     Rate and Rhythm: Normal rate and regular rhythm.     Heart sounds: Normal heart sounds. No murmur heard. Pulmonary:     Effort: Pulmonary effort is normal.     Breath sounds: Normal breath sounds.  Abdominal:     Palpations: Abdomen is soft.     Tenderness: There is no abdominal tenderness. There is no guarding or rebound.  Musculoskeletal:        General: Normal range of motion.     Cervical back: Normal range of motion.   Lymphadenopathy:     Cervical: No cervical adenopathy.  Neurological:     General: No focal deficit present.     Mental Status: She is alert and oriented to person, place, and time.     Cranial Nerves: No cranial nerve deficit.  Skin:    General: Skin is warm and dry.  Psychiatric:        Mood and Affect: Mood normal.        Behavior: Behavior normal.        Thought Content: Thought content normal.        Judgment: Judgment normal.  Vitals reviewed.     Assessment/Plan: Encounter for annual routine gynecological examination  Encounter for surveillance of contraceptive pills - Plan: norgestrel-ethinyl estradiol (LOW-OGESTREL ) 0.3-30 MG-MCG tablet; OCP RF  Encounter for screening mammogram for malignant  neoplasm of breast - Plan: MM 3D SCREENING MAMMOGRAM BILATERAL BREAST; pt to schedule mammo  Blood tests for routine general physical examination - Plan: Comprehensive metabolic panel, Lipid Panel With LDL/HDL Ratio  Screening cholesterol level - Plan: Lipid Panel With LDL/HDL Ratio  Tinea corporis - Plan: fluconazole  (DIFLUCAN ) 200 MG tablet; Rx RF. Keep skin dry. F/u prn.    No orders of the defined types were placed in this encounter.            GYN counsel adequate intake of calcium and vitamin D, diet and exercise     F/U  No follow-ups on file.  Idrissa Beville B. Kashia Brossard, PA-C 10/27/2023 1:00 PM

## 2023-10-28 ENCOUNTER — Ambulatory Visit: Admitting: Obstetrics and Gynecology

## 2023-10-28 ENCOUNTER — Encounter: Payer: Self-pay | Admitting: Obstetrics and Gynecology

## 2023-10-28 VITALS — BP 110/65 | HR 95 | Ht 63.0 in | Wt 143.0 lb

## 2023-10-28 DIAGNOSIS — Z3041 Encounter for surveillance of contraceptive pills: Secondary | ICD-10-CM | POA: Diagnosis not present

## 2023-10-28 DIAGNOSIS — Z1231 Encounter for screening mammogram for malignant neoplasm of breast: Secondary | ICD-10-CM

## 2023-10-28 DIAGNOSIS — B354 Tinea corporis: Secondary | ICD-10-CM | POA: Diagnosis not present

## 2023-10-28 DIAGNOSIS — Z0000 Encounter for general adult medical examination without abnormal findings: Secondary | ICD-10-CM

## 2023-10-28 DIAGNOSIS — Z01411 Encounter for gynecological examination (general) (routine) with abnormal findings: Secondary | ICD-10-CM | POA: Diagnosis not present

## 2023-10-28 DIAGNOSIS — Z13220 Encounter for screening for lipoid disorders: Secondary | ICD-10-CM

## 2023-10-28 DIAGNOSIS — Z01419 Encounter for gynecological examination (general) (routine) without abnormal findings: Secondary | ICD-10-CM

## 2023-10-28 MED ORDER — FLUCONAZOLE 200 MG PO TABS: ORAL_TABLET | ORAL | 1 refills | Status: AC

## 2023-10-28 MED ORDER — LOW-OGESTREL 0.3-30 MG-MCG PO TABS: 1.0000 | ORAL_TABLET | Freq: Every day | ORAL | 4 refills | Status: AC

## 2023-10-28 NOTE — Patient Instructions (Addendum)
 I value your feedback and you entrusting [place] with your care. If you get a [facility] patient survey, I would appreciate you taking the time to let us know about your experience today. Thank you!  [facility] ([place])--[phone]

## 2023-10-29 LAB — LIPID PANEL
Chol/HDL Ratio: 4.9 ratio — ABNORMAL HIGH (ref 0.0–4.4)
Cholesterol, Total: 195 mg/dL (ref 100–199)
HDL: 40 mg/dL (ref >39)
LDL Chol Calc (NIH): 141 mg/dL — ABNORMAL HIGH (ref 0–99)
Triglycerides: 74 mg/dL (ref 0–149)
VLDL Cholesterol Cal: 14 mg/dL (ref 5–40)

## 2023-10-29 LAB — COMPREHENSIVE METABOLIC PANEL WITH GFR
ALT: 14 IU/L (ref 0–32)
AST: 16 IU/L (ref 0–40)
Albumin: 4.7 g/dL (ref 3.9–4.9)
Alkaline Phosphatase: 42 IU/L — ABNORMAL LOW (ref 44–121)
BUN/Creatinine Ratio: 16 (ref 9–23)
BUN: 13 mg/dL (ref 6–24)
Bilirubin Total: 0.6 mg/dL (ref 0.0–1.2)
CO2: 20 mmol/L (ref 20–29)
Calcium: 9 mg/dL (ref 8.7–10.2)
Chloride: 107 mmol/L — ABNORMAL HIGH (ref 96–106)
Creatinine, Ser: 0.79 mg/dL (ref 0.57–1.00)
Globulin, Total: 2.2 g/dL (ref 1.5–4.5)
Glucose: 79 mg/dL (ref 70–99)
Potassium: 4.7 mmol/L (ref 3.5–5.2)
Sodium: 139 mmol/L (ref 134–144)
Total Protein: 6.9 g/dL (ref 6.0–8.5)
eGFR: 96 mL/min/1.73 (ref >59)

## 2023-10-31 ENCOUNTER — Ambulatory Visit: Payer: Self-pay | Admitting: Obstetrics and Gynecology

## 2023-11-22 ENCOUNTER — Encounter: Payer: Self-pay | Admitting: Obstetrics and Gynecology

## 2023-12-01 ENCOUNTER — Encounter: Payer: Self-pay | Admitting: Family Medicine

## 2023-12-02 ENCOUNTER — Ambulatory Visit: Payer: Self-pay

## 2023-12-02 ENCOUNTER — Encounter: Payer: Self-pay | Admitting: Obstetrics and Gynecology

## 2023-12-02 NOTE — Telephone Encounter (Signed)
 FYI Only or Action Required?: FYI only for provider.  Patient was last seen in primary care on na.  Called Nurse Triage reporting Tick Removal.  Symptoms began several weeks ago.  Interventions attempted: Prescription medications: finished doxycycline.  Symptoms are: gradually worsening.  Triage Disposition: Go to ED Now (or PCP Triage)  Patient/caregiver understands and will follow disposition?: Yes            Copied from CRM [id number]. Topic: Clinical - Red Word Triage >> Dec 02, 2023 10:08 AM Larissa RAMAN wrote: [facility] that prompted transfer to Nurse Triage: neck pain/stiffness, back pain. Tick bite. Reason for Disposition  [1] 2 to 14 days following tick bite AND [2] severe headache with fever occurs  Answer Assessment - Initial Assessment Questions 1. ATTACHED:  Is the tick still on the skin?  (e.g., yes, no, unsure)     Two weeks ago on 11/14/23 2. ONSET - TICK STILL ATTACHED:  How long do you think the tick has been on your skin? (e.g., hours, days, unsure)  Note:  Is there a recent activity (camping, hiking) where the caller may have been exposed?     na 3. ONSET - TICK NOT STILL ATTACHED: If the tick has been removed, how long do you think the tick was attached before you removed it? (e.g., 5 hours, 2 days). When was this?     na 4. LOCATION: Where is the tick bite located? (e.g., arm, leg)     On neck bottom of hair line 5. TYPE of TICK: Is it a wood tick or a deer tick? (e.g., deer tick, wood tick; unsure)     unknown 6. SIZE of TICK: How big is the tick? (e.g., size of poppy seed, apple seed, watermelon seed; unsure) Note: Deer ticks can be the size of a poppy seed (nymph) or an apple seed (adult).       small 7. ENGORGED: Did the tick look flat or engorged (full, swollen)? (e.g., flat, engorged; unsure)     Denies; but was treated at [facility] with doxycycline 8. OTHER SYMPTOMS: Do you have any other symptoms? (e.g., fever, rash, redness at bite  area, red ring around bite)     Stiff sore neck 9. PREGNANCY: Is there any chance you are pregnant? When was your last menstrual period?     na  Protocols used: Tick Bite-A-AH

## 2023-12-12 ENCOUNTER — Other Ambulatory Visit: Payer: Self-pay | Admitting: Obstetrics and Gynecology

## 2023-12-12 MED ORDER — CYCLOBENZAPRINE HCL 10 MG PO TABS: 10.0000 mg | ORAL_TABLET | Freq: Two times a day (BID) | ORAL | 0 refills | Status: AC | PRN

## 2023-12-12 NOTE — Progress Notes (Signed)
 Rx RF eRxd for muscle pain after tick bite, ok per PCP

## 2024-02-16 ENCOUNTER — Other Ambulatory Visit: Payer: Self-pay | Admitting: Medical Genetics

## 2024-02-18 ENCOUNTER — Other Ambulatory Visit
Admission: RE | Admit: 2024-02-18 | Discharge: 2024-02-18 | Disposition: A | Discharge status: Home / Self Care | Payer: Self-pay | Source: Ambulatory Visit | Attending: Medical Genetics | Admitting: Medical Genetics

## 2024-02-23 ENCOUNTER — Encounter: Payer: Self-pay | Admitting: Family Medicine

## 2024-02-23 ENCOUNTER — Ambulatory Visit: Admitting: Family Medicine

## 2024-02-23 VITALS — BP 125/70 | HR 72 | Ht 63.0 in | Wt 145.9 lb

## 2024-02-23 DIAGNOSIS — Z789 Other specified health status: Secondary | ICD-10-CM | POA: Diagnosis not present

## 2024-02-23 DIAGNOSIS — W57XXXD Bitten or stung by nonvenomous insect and other nonvenomous arthropods, subsequent encounter: Secondary | ICD-10-CM

## 2024-02-23 DIAGNOSIS — S0300XA Dislocation of jaw, unspecified side, initial encounter: Secondary | ICD-10-CM | POA: Insufficient documentation

## 2024-02-23 DIAGNOSIS — E559 Vitamin D deficiency, unspecified: Secondary | ICD-10-CM

## 2024-02-23 DIAGNOSIS — S0086XD Insect bite (nonvenomous) of other part of head, subsequent encounter: Secondary | ICD-10-CM

## 2024-02-23 DIAGNOSIS — Z114 Encounter for screening for human immunodeficiency virus [HIV]: Secondary | ICD-10-CM

## 2024-02-23 DIAGNOSIS — S0300XD Dislocation of jaw, unspecified side, subsequent encounter: Secondary | ICD-10-CM

## 2024-02-23 DIAGNOSIS — Z1159 Encounter for screening for other viral diseases: Secondary | ICD-10-CM

## 2024-02-23 DIAGNOSIS — J0110 Acute frontal sinusitis, unspecified: Secondary | ICD-10-CM

## 2024-02-23 MED ORDER — TRIAMCINOLONE ACETONIDE 0.1 % EX CREA: 1.0000 | TOPICAL_CREAM | Freq: Two times a day (BID) | CUTANEOUS | 0 refills | Status: AC

## 2024-02-23 MED ORDER — AMOXICILLIN 875 MG PO TABS: 875.0000 mg | ORAL_TABLET | Freq: Two times a day (BID) | ORAL | 0 refills | Status: AC

## 2024-02-23 NOTE — Patient Instructions (Signed)
 To keep you healthy, please keep in mind the following health maintenance items that you are due for:   Health Maintenance Due  Topic Date Due   HIV Screening  Never done   Hepatitis C Screening  Never done   Hepatitis B Vaccines 19-59 Average Risk (1 of 3 - 19+ 3-dose series) Never done     Best Wishes,   Dr. Lang

## 2024-02-23 NOTE — Progress Notes (Signed)
 New patient visit   Patient: Dana Baldwin   DOB: 03-21-1981   43 y.o. Female  MRN: [id number] Visit Date: 02/23/2024  Today's healthcare provider: Rockie Agent, MD   Chief Complaint  Patient presents with   Establish Care    Patient presents to establish care with new pcp.  Diet is normal and well balanced per patient. High protein. Exercising 4-5 days week for 1.5 hour Vaccines: Screenings:    Insect Bite    Patient reports that last week of July she found tick on her neck, had treatment and labs for lyme's disease and all came back negative but still has itching and redness at site.    Subjective    Dana Baldwin is a 43 y.o. female who presents today as a new patient to establish care.   HPI     Establish Care    Additional comments: Patient presents to establish care with new pcp.  Diet is normal and well balanced per patient. High protein. Exercising 4-5 days week for 1.5 hour Vaccines: Screenings:         Insect Bite    Additional comments: Patient reports that last week of July she found tick on her neck, had treatment and labs for lyme's disease and all came back negative but still has itching and redness at site.       Last edited by Cherry Chiquita HERO, CMA on 02/23/2024  3:39 PM.       Discussed the use of AI scribe software for clinical note transcription with the patient, who gave verbal consent to proceed.  History of Present Illness Dana Baldwin is a 43 year old female who presents with a tick bite on her neck.  At the end of July, she discovered a tick on her neck and was treated with doxycycline for approximately one week. Despite treatment, she continues to experience redness and itching at the bite site, located at the base of her hairline, along with neck stiffness. She uses muscle relaxers as needed for neck stiffness and TMJ symptoms. A Lyme disease panel was conducted and returned negative results.  She has a history of  temporomandibular joint disorder (TMJ), which causes her jaw to lock and become painful, especially after chewing gum or eating chewy foods. She manages this condition with muscle relaxers as needed.  She takes oral contraception and uses Diflucan  400 mg twice a year to manage a yeast infection on her back, exacerbated by sweat from her sports bra. She takes two pills initially and another two pills a week later, which typically resolves the issue for about six months.  She has a history of low vitamin D levels and has recently restarted taking vitamin D3 supplements.  She is experiencing sinus pressure and congestion, worsening over the past week, with symptoms including facial pressure, nasal congestion, and watery, itchy eyes. She has been using over-the-counter DayQuil for relief.  She works in a teacher, adult education and lives with her husband, who works in sales promotion account executive. She has two adult children.      Past Medical History:  Diagnosis Date   Ovarian cyst 2019   LEFT   TMJ (dislocation of temporomandibular joint)     Outpatient Medications Prior to Visit  Medication Sig   cholecalciferol (VITAMIN D3) 25 MCG (1000 UNIT) tablet Take 1,000 Units by mouth daily.   cyclobenzaprine  (FLEXERIL ) 10 MG tablet Take 1 tablet (10 mg total) by mouth 2 (two) times daily as needed for  muscle spasms.   fluconazole  (DIFLUCAN ) 200 MG tablet Take 2 tabs, wait a week, then repeat   norgestrel-ethinyl estradiol (LOW-OGESTREL ) 0.3-30 MG-MCG tablet Take 1 tablet by mouth daily. CONTINUOUS DOSING   No facility-administered medications prior to visit.    Past Surgical History:  Procedure Laterality Date   BREAST ENHANCEMENT SURGERY  03/2022   LAPAROSCOPIC OVARIAN CYSTECTOMY Bilateral 01/11/2017   Procedure: LAPAROSCOPIC OVARIAN CYSTECTOMY;  Surgeon: Verdon Keen, MD;  Location: [facility];  Service: Gynecology;  Laterality: Bilateral;   LAPAROSCOPIC OVARIAN CYSTECTOMY Right 04/30/2017   Procedure:  LAPAROSCOPIC OVARIAN CYSTECTOMY;  Surgeon: Verdon Keen, MD;  Location: [facility];  Service: Gynecology;  Laterality: Right;   LAPAROSCOPY N/A 04/30/2017   Procedure: LAPAROSCOPY DIAGNOSTIC;  Surgeon: Verdon Keen, MD;  Location: [facility];  Service: Gynecology;  Laterality: N/A;   LAPAROSCOPY N/A 05/28/2017   Procedure: LAPAROSCOPY OPERATIVE;  Surgeon: Verdon Keen, MD;  Location: [facility];  Service: Gynecology;  Laterality: N/A;   OVARIAN CYST REMOVAL Left 05/28/2017   Procedure: OVARIAN CYSTECTOMY;  Surgeon: Verdon Keen, MD;  Location: [facility];  Service: Gynecology;  Laterality: Left;   TENDON REPAIR Right    pinky finger   Family Status  Relation Name Status   Mother  Alive   Father  Alive   Mat Aunt  Deceased  No partnership data on file   Family History  Problem Relation Age of Onset   Other Mother        Degenerative Disc Disease   Heart attack Father 73   Sleep apnea Father    Cancer Maternal Aunt        started in kidneys and spread to entire body   Social History   Socioeconomic History   Marital status: Married    Spouse name: Not on file   Number of children: Not on file   Years of education: Not on file   Highest education level: Not on file  Occupational History   Not on file  Tobacco Use   Smoking status: Never   Smokeless tobacco: Never  Vaping Use   Vaping status: Never Used  Substance and Sexual Activity   Alcohol use: Yes    Comment: Occasionally, less than weekly   Drug use: No   Sexual activity: Yes    Birth control/protection: Pill  Other Topics Concern   Not on file  Social History Narrative   Not on file   Social Drivers of Health   Financial Resource Strain: Not on file  Food Insecurity: Not on file  Transportation Needs: Not on file  Physical Activity: Not on file  Stress: Not on file  Social Connections: Not on file     Allergies  Allergen Reactions   Gluten Meal Diarrhea    Per patient headaches, diarrhea/  gi upset    Guaifenesin Other (See Comments)    feels like it is too strong   Sudafed [Pseudoephedrine] Other (See Comments)    Makes pt feel bad when taking med    Immunization History  Administered Date(s) Administered   Moderna Sars-Covid-2 Vaccination 09/21/2020   Tdap 03/12/2014    Health Maintenance  Topic Date Due   HIV Screening  Never done   Hepatitis C Screening  Never done   Hepatitis B Vaccines 19-59 Average Risk (1 of 3 - 19+ 3-dose series) Never done   COVID-19 Vaccine (2 - 2025-26 season) 03/10/2024 (Originally 12/20/2023)   Influenza Vaccine  07/18/2024 (Originally 11/19/2023)   HPV VACCINES (1 - 3-dose  SCDM series) 02/22/2025 (Originally 06/22/2009)   DTaP/Tdap/Td (2 - Td or Tdap) 03/12/2024   Mammogram  02/10/2025   Cervical Cancer Screening (HPV/Pap Cotest)  04/08/2026   Pneumococcal Vaccine  Aged Out   Meningococcal B Vaccine  Aged Out    Patient Care Team: Sharma Coyer, MD as PCP - General (Family Medicine)  Review of Systems  Last CBC Lab Results  Component Value Date   WBC 6.3 08/27/2023   HGB 15.9 08/27/2023   HCT 47.3 (H) 08/27/2023   MCV 93 08/27/2023   MCH 31.4 08/27/2023   RDW 11.8 08/27/2023   PLT 287 08/27/2023   Last metabolic panel Lab Results  Component Value Date   GLUCOSE 79 10/28/2023   NA 139 10/28/2023   K 4.7 10/28/2023   CL 107 (H) 10/28/2023   CO2 20 10/28/2023   BUN 13 10/28/2023   CREATININE 0.79 10/28/2023   EGFR 96 10/28/2023   CALCIUM 9.0 10/28/2023   PROT 6.9 10/28/2023   ALBUMIN 4.7 10/28/2023   LABGLOB 2.2 10/28/2023   AGRATIO 2.2 07/29/2022   BILITOT 0.6 10/28/2023   ALKPHOS 42 (L) 10/28/2023   AST 16 10/28/2023   ALT 14 10/28/2023   ANIONGAP 8 04/30/2017   Last lipids Lab Results  Component Value Date   CHOL 195 10/28/2023   HDL 40 10/28/2023   LDLCALC 141 (H) 10/28/2023   TRIG 74 10/28/2023   CHOLHDL 4.9 (H) 10/28/2023   Last hemoglobin A1c No results found for: HGBA1C Last  thyroid  functions Lab Results  Component Value Date   TSH 1.960 08/27/2023   T4TOTAL 9.3 12/23/2022   FREET4 1.10 08/27/2023   Last vitamin D No results found for: 25OHVITD2, 25OHVITD3, VD25OH Last vitamin B12 and Folate No results found for: VITAMINB12, FOLATE      Objective    BP 125/70 (BP Location: Left Arm, Patient Position: Sitting, Cuff Size: Normal)   Pulse 72   Ht 5' 3 (1.6 m)   Wt 145 lb 14.4 oz (66.2 kg)   SpO2 100%   BMI 25.85 kg/m   BP Readings from Last 3 Encounters:  02/23/24 125/70  10/28/23 110/65  08/24/23 (!) 145/76   Wt Readings from Last 3 Encounters:  02/23/24 145 lb 14.4 oz (66.2 kg)  10/28/23 143 lb (64.9 kg)  08/24/23 160 lb 12.8 oz (72.9 kg)        Depression Screen    02/23/2024    3:34 PM  PHQ 2/9 Scores  PHQ - 2 Score 0  PHQ- 9 Score 0   No results found for any visits on 02/23/24.   Physical Exam Physical Exam HEENT: Nasal turbinates boggy and enlarged on right side. Left tympanic membrane with effusion and bubbles. Throat normal. Nasal passages clear. NECK: Redness and itching at the site of tick bite on neck. CARD: RRR without murmur  PULM: CTAB  ABD: nondistended, soft, normal BS      Assessment & Plan      Problem List Items Addressed This Visit     Dislocation of jaw   Vitamin D deficiency   Relevant Orders   Vitamin D (25 hydroxy)   Other Visit Diagnoses       Tick bite of other part of head, subsequent encounter    -  Primary   Relevant Medications   triamcinolone cream (KENALOG) 0.1 %     Encounter for hepatitis C screening test for low risk patient       Relevant Orders  Hepatitis C Antibody     Screening for HIV (human immunodeficiency virus)       Relevant Orders   HIV antibody (with reflex)     Hepatitis B vaccination status unknown       Relevant Orders   Hepatitis B Surface AntiBODY     Acute non-recurrent frontal sinusitis       Relevant Medications   amoxicillin (AMOXIL) 875  MG tablet       Assessment and Plan Assessment & Plan Acute sinusitis with left middle ear effusion Sinus pressure and congestion for one week, worsening. Left middle ear effusion with fluid congestion. Right nasal turbinate enlargement. Differential includes sinus infection. - Prescribed amoxicillin 875 mg twice a day for one week.  Pruritic dermatitis at site of recent tick bite, neck Subacute Persistent redness and itching at the site of a tick bite on the neck. Lyme disease panel negative. Previous treatment with doxycycline. Considering residual inflammation from the tick bite. - Prescribed triamcinolone 0.1% cream to apply twice a day for no more than two weeks.  Temporomandibular joint (TMJ) dysfunction Chronic  TMJ dysfunction managed with muscle relaxers as needed. Symptoms exacerbated by chewing gum and chewy foods. - Continue muscle relaxers as needed for TMJ pain.  Vitamin D deficiency Chronic  Previous low vitamin D levels. Currently taking vitamin D3 supplements. - Checked vitamin D levels. - continue OTC supplementation, goal 30 or greater  General Health Maintenance Routine health maintenance discussed. Pap smear up to date. Mammogram due but not yet scheduled. Discussed the importance of regular screenings and maintaining a healthy lifestyle. - Will schedule mammogram. - Continue routine health screenings. - HIV and Hep C screenings collected  - Hep B antibody titers collected       Return in about 7 months (around 09/22/2024) for CPE.      Rockie Agent, MD  [facility] [phone] (phone) [phone] (fax)  [facility]

## 2024-02-24 LAB — VITAMIN D 25 HYDROXY (VIT D DEFICIENCY, FRACTURES): Vit D, 25-Hydroxy: 59.1 ng/mL (ref 30.0–100.0)

## 2024-02-24 LAB — HIV ANTIBODY (ROUTINE TESTING W REFLEX): HIV Screen 4th Generation wRfx: NONREACTIVE

## 2024-02-24 LAB — HEPATITIS B SURFACE ANTIBODY,QUALITATIVE: Hep B Surface Ab, Qual: REACTIVE

## 2024-02-24 LAB — HEPATITIS C ANTIBODY: Hep C Virus Ab: NONREACTIVE

## 2024-02-25 ENCOUNTER — Ambulatory Visit: Payer: Self-pay | Admitting: Family Medicine

## 2024-02-28 LAB — GENECONNECT MOLECULAR SCREEN: Genetic Analysis Overall Interpretation: NEGATIVE

## 2024-09-25 ENCOUNTER — Encounter: Admitting: Family Medicine
